# Patient Record
Sex: Male | Born: 1950 | Race: White | Hispanic: No | Marital: Single | Smoking: Former smoker
Health system: Southern US, Community
[De-identification: ages and names within clinical notes are randomized; demographics above are authoritative.]

## PROBLEM LIST (undated history)

## (undated) DIAGNOSIS — E785 Hyperlipidemia, unspecified: Secondary | ICD-10-CM

## (undated) DIAGNOSIS — I10 Essential (primary) hypertension: Secondary | ICD-10-CM

## (undated) DIAGNOSIS — E119 Type 2 diabetes mellitus without complications: Secondary | ICD-10-CM

## (undated) HISTORY — DX: Hyperlipidemia, unspecified: E78.5

## (undated) HISTORY — DX: Type 2 diabetes mellitus without complications: E11.9

## (undated) HISTORY — DX: Essential (primary) hypertension: I10

---

## 2001-08-21 ENCOUNTER — Encounter: Payer: Self-pay | Admitting: Gastroenterology

## 2001-08-21 ENCOUNTER — Ambulatory Visit (HOSPITAL_COMMUNITY): Admission: RE | Admit: 2001-08-21 | Discharge: 2001-08-21 | Payer: Self-pay | Admitting: Gastroenterology

## 2001-09-16 ENCOUNTER — Encounter: Payer: Self-pay | Admitting: General Surgery

## 2001-09-20 ENCOUNTER — Encounter: Payer: Self-pay | Admitting: General Surgery

## 2001-09-20 ENCOUNTER — Observation Stay (HOSPITAL_COMMUNITY): Admission: RE | Admit: 2001-09-20 | Discharge: 2001-09-21 | Payer: Self-pay | Admitting: General Surgery

## 2002-03-18 LAB — HM COLONOSCOPY

## 2004-07-25 ENCOUNTER — Ambulatory Visit: Payer: Self-pay | Admitting: *Deleted

## 2004-10-13 ENCOUNTER — Ambulatory Visit: Payer: Self-pay | Admitting: Endocrinology

## 2005-07-07 ENCOUNTER — Ambulatory Visit: Payer: Self-pay | Admitting: Endocrinology

## 2005-07-14 ENCOUNTER — Ambulatory Visit: Payer: Self-pay | Admitting: Endocrinology

## 2012-01-05 ENCOUNTER — Encounter: Payer: Self-pay | Admitting: Gastroenterology

## 2013-01-08 ENCOUNTER — Encounter: Payer: Self-pay | Admitting: Gastroenterology

## 2013-09-01 DIAGNOSIS — IMO0001 Reserved for inherently not codable concepts without codable children: Secondary | ICD-10-CM | POA: Insufficient documentation

## 2014-08-12 ENCOUNTER — Ambulatory Visit (INDEPENDENT_AMBULATORY_CARE_PROVIDER_SITE_OTHER): Payer: BC Managed Care – PPO | Admitting: Family Medicine

## 2014-08-12 ENCOUNTER — Encounter: Payer: Self-pay | Admitting: Family Medicine

## 2014-08-12 VITALS — BP 190/123 | HR 114 | Ht 72.0 in | Wt 275.0 lb

## 2014-08-12 DIAGNOSIS — I10 Essential (primary) hypertension: Secondary | ICD-10-CM | POA: Insufficient documentation

## 2014-08-12 DIAGNOSIS — R8290 Unspecified abnormal findings in urine: Secondary | ICD-10-CM | POA: Insufficient documentation

## 2014-08-12 DIAGNOSIS — E119 Type 2 diabetes mellitus without complications: Secondary | ICD-10-CM

## 2014-08-12 DIAGNOSIS — E785 Hyperlipidemia, unspecified: Secondary | ICD-10-CM | POA: Insufficient documentation

## 2014-08-12 HISTORY — DX: Type 2 diabetes mellitus without complications: E11.9

## 2014-08-12 NOTE — Progress Notes (Signed)
CC: George SkeetersMark C Spence is a 63 y.o. male is here for Establish Care   Subjective: HPI:  Last name pronounced "Store",  Pleasant 63 year old here to establish care, [employer]  Patient reports a history of type 2 diabetes diagnosed over 10 years ago. He was seeing George Spence for about 4 years, was taking at least metformin and may be other pills but never on injectable medications. He stopped seeing him after he was able to get his diabetes "under control " by reducing carbohydrate intake. Over the past few weeks he's noticed fatigue, increased urinary frequency, awakening every 2 hours at night to urinate and a foul odor to his urine. He is worried that his blood sugars may be rising. No current diet or exercise interventions for type 2 diabetes.  Reports a history of essential hypertension with no outside blood pressures to report. He's never been on any blood pressure lowering medications. He reports a high salt diet and frequently eats soups from [place] restaurants that sound like they have quite a bit of sodium. He is clear that he does not want to address his blood pressure at this time.  Reports a history of hyperlipidemia. He's never been on cholesterol-lowering medication. No known cardiovascular disease. Unsure about most recent lipid panel, likely greater than year ago  Reports malodorous urine describes it as sweet but also foul-smelling. No interventions as of yet. Present all hours of the day. Has been present for at least 1 or 2 weeks does not seem to be getting better or worse since onset.  Review of Systems - General ROS: negative for - chills, fever, night sweats, weight gain or weight loss Ophthalmic ROS: negative for - decreased vision Psychological ROS: negative for - anxiety or depression ENT ROS: negative for - hearing change, nasal congestion, tinnitus or allergies Hematological and Lymphatic ROS: negative for - bleeding problems, bruising or swollen lymph  nodes Breast ROS: negative Respiratory ROS: no cough, , or wheezing Cardiovascular ROS: no chest pain or dyspnea on exertion Gastrointestinal ROS: no abdominal pain, change in bowel habits, or black or bloody stools Genito-Urinary ROS: negative for - genital discharge, genital ulcers, incontinence or abnormal bleeding from genitals Musculoskeletal ROS: negative for - joint pain Neurological ROS: negative for - headaches or memory loss Dermatological ROS: negative for lumps, mole changes, rash and skin lesion changes  Past Medical History  Diagnosis Date  . Hypertension   . Diabetes   . Hyperlipidemia   . Type 2 diabetes mellitus without complication 08/12/2014    History reviewed. No pertinent past surgical history. History reviewed. No pertinent family history.  History   Social History  . Marital Status: Single    Spouse Name: N/A    Number of Children: N/A  . Years of Education: N/A   Occupational History  . Not on file.   Social History Main Topics  . Smoking status: Former Smoker    Quit date: 08/13/1979  . Smokeless tobacco: Not on file  . Alcohol Use: 1.2 oz/week    2 Not specified per week  . Drug Use: No  . Sexual Activity:    Partners: Female   Other Topics Concern  . Not on file   Social History Narrative  . No narrative on file     Objective: BP 190/123 mmHg  Pulse 114  Ht 6' (1.829 m)  Wt 275 lb (124.739 kg)  BMI 37.29 kg/m2  SpO2 94%  General: Alert and Oriented, No Acute Distress HEENT:  Pupils equal, round, reactive to light. Conjunctivae clear.  External ears unremarkable, canals clear with intact TMs with appropriate landmarks.  Middle ear appears open without effusion. Pink inferior turbinates.  Moist mucous membranes, pharynx without inflammation nor lesions.  Neck supple without palpable lymphadenopathy nor abnormal masses. Lungs: Clear to auscultation bilaterally, no wheezing/ronchi/rales.  Comfortable work of breathing. Good air  movement. Cardiac: Regular rate and rhythm. Normal S1/S2.  No murmurs, rubs, nor gallops.  No carotid bruits Abdomen: obese and soft Extremities: No peripheral edema.  Strong peripheral pulses.  Mental Status: No depression, anxiety, nor agitation. Skin: Warm and dry.  Assessment & Plan: George Spence was seen today for establish care.  Diagnoses and associated orders for this visit:  Type 2 diabetes mellitus without complication - Microalbumin / creatinine urine ratio - Hemoglobin A1c - BASIC METABOLIC PANEL WITH GFR  Essential hypertension - BASIC METABOLIC PANEL WITH GFR  Hyperlipidemia  Malodorous urine - Urinalysis, Routine w reflex microscopic    Type 2 diabetes: Clinically uncontrolled checking A1c and renal function today. Well overdue for urine microalbumin creatinine ratio Essential hypertension: Uncontrolled discussed sodium restriction to help with blood pressure. He does not want any further addressing be done to his blood pressure, I let him know that his quite uncontrolled today Hyperlipidemia: Will obtain lipid panel with future when he is fasting Malodorous urine: Urinalysis to rule out UTI, likely glycosuria this contributed to some of the smell.  Return in about 4 weeks (around 09/09/2014) for HTN DM.

## 2014-08-13 ENCOUNTER — Telehealth: Payer: Self-pay | Admitting: Family Medicine

## 2014-08-13 DIAGNOSIS — E119 Type 2 diabetes mellitus without complications: Secondary | ICD-10-CM | POA: Insufficient documentation

## 2014-08-13 DIAGNOSIS — R809 Proteinuria, unspecified: Principal | ICD-10-CM

## 2014-08-13 DIAGNOSIS — E11319 Type 2 diabetes mellitus with unspecified diabetic retinopathy without macular edema: Secondary | ICD-10-CM | POA: Insufficient documentation

## 2014-08-13 DIAGNOSIS — E1129 Type 2 diabetes mellitus with other diabetic kidney complication: Secondary | ICD-10-CM

## 2014-08-13 LAB — URINALYSIS, MICROSCOPIC ONLY
Casts: NONE SEEN
Crystals: NONE SEEN
Squamous Epithelial / HPF: NONE SEEN

## 2014-08-13 LAB — MICROALBUMIN / CREATININE URINE RATIO
Creatinine, Urine: 156 mg/dL
Microalb Creat Ratio: 1138.5 mg/g — ABNORMAL HIGH (ref 0.0–30.0)
Microalb, Ur: 177.6 mg/dL — ABNORMAL HIGH (ref <2.0)

## 2014-08-13 LAB — URINALYSIS, ROUTINE W REFLEX MICROSCOPIC
Bilirubin Urine: NEGATIVE
Glucose, UA: NEGATIVE mg/dL
Ketones, ur: NEGATIVE mg/dL
Nitrite: NEGATIVE
Protein, ur: 100 mg/dL — AB
Specific Gravity, Urine: 1.021 (ref 1.005–1.030)
Urobilinogen, UA: 1 mg/dL (ref 0.0–1.0)
pH: 5.5 (ref 5.0–8.0)

## 2014-08-13 LAB — BASIC METABOLIC PANEL WITH GFR
BUN: 22 mg/dL (ref 6–23)
CO2: 27 mEq/L (ref 19–32)
Calcium: 9.3 mg/dL (ref 8.4–10.5)
Chloride: 101 mEq/L (ref 96–112)
Creat: 1.33 mg/dL (ref 0.50–1.35)
GFR, Est African American: 65 mL/min
GFR, Est Non African American: 56 mL/min — ABNORMAL LOW
Glucose, Bld: 253 mg/dL — ABNORMAL HIGH (ref 70–99)
Potassium: 4.3 mEq/L (ref 3.5–5.3)
Sodium: 138 mEq/L (ref 135–145)

## 2014-08-13 LAB — HEMOGLOBIN A1C
Hgb A1c MFr Bld: 9.4 % — ABNORMAL HIGH (ref <5.7)
Mean Plasma Glucose: 223 mg/dL — ABNORMAL HIGH (ref <117)

## 2014-08-13 MED ORDER — LISINOPRIL 20 MG PO TABS: 20.0000 mg | ORAL_TABLET | Freq: Every day | ORAL | Status: DC

## 2014-08-13 MED ORDER — GLYBURIDE-METFORMIN 2.5-500 MG PO TABS: 2.0000 | ORAL_TABLET | Freq: Two times a day (BID) | ORAL | Status: DC

## 2014-08-13 NOTE — Telephone Encounter (Signed)
[name], Will you please let patient know that his A1c was 9.4 with a goal of less than 7.  I've sent a metformin containing diabetic [facility] which will make a big impact at improving his blood sugar.  His kidney function and urine studies is showing a degree of protein in his urine that reflects mild to moderate kidney impairment due to his diabetes.  I'd recommend he start an additional medication called lisinopril which will help BP bust most importantly help preserve kidney function.  F/U with me 1 month.

## 2014-08-13 NOTE — Telephone Encounter (Signed)
Message left on pt's vm with results

## 2014-09-15 ENCOUNTER — Encounter: Payer: Self-pay | Admitting: Family Medicine

## 2014-09-15 ENCOUNTER — Ambulatory Visit (INDEPENDENT_AMBULATORY_CARE_PROVIDER_SITE_OTHER): Payer: BC Managed Care – PPO | Admitting: Family Medicine

## 2014-09-15 VITALS — BP 179/93 | HR 75 | Ht 72.0 in | Wt 275.0 lb

## 2014-09-15 DIAGNOSIS — R809 Proteinuria, unspecified: Secondary | ICD-10-CM

## 2014-09-15 DIAGNOSIS — I10 Essential (primary) hypertension: Secondary | ICD-10-CM | POA: Diagnosis not present

## 2014-09-15 DIAGNOSIS — E1129 Type 2 diabetes mellitus with other diabetic kidney complication: Secondary | ICD-10-CM

## 2014-09-15 DIAGNOSIS — E119 Type 2 diabetes mellitus without complications: Secondary | ICD-10-CM | POA: Diagnosis not present

## 2014-09-15 NOTE — Progress Notes (Signed)
CC: George Spence is a 63 y.o. male is here for Follow-up   Subjective: HPI:  Follow-up type 2 diabetes: Since I saw him last he began taking full dose of glyburide/metformin and for the first 3 days had a foggy cognitive sensation, clammy feeling and just felt off therefore stop this medication and within a day felt back to his normal self. He restarted taking this medication but only 2.5/500 mg daily. He's not had a reoccurrence of any assistive symptoms. He brings in blood sugars that have been taken randomly throughout the day all of which have been between 202 175. He reports that he is no longer waking 3-4 times at night to urinate, now on average only once. He denies polyphagia or polydipsia nor poorly healing wounds.  He has begun taking lisinopril since I saw him last with no outside blood pressures to report. Again he only wants to focus on eating blood sugar under control first before addressing blood pressure   Review Of Systems Outlined In HPI  Past Medical History  Diagnosis Date  . Hypertension   . Diabetes   . Hyperlipidemia   . Type 2 diabetes mellitus without complication 08/12/2014    No past surgical history on file. No family history on file.  History   Social History  . Marital Status: Single    Spouse Name: N/A    Number of Children: N/A  . Years of Education: N/A   Occupational History  . Not on file.   Social History Main Topics  . Smoking status: Former Smoker    Quit date: 08/13/1979  . Smokeless tobacco: Not on file  . Alcohol Use: 1.2 oz/week    2 Not specified per week  . Drug Use: No  . Sexual Activity:    Partners: Female   Other Topics Concern  . Not on file   Social History Narrative     Objective: BP 179/93 mmHg  Pulse 75  Ht 6' (1.829 m)  Wt 275 lb (124.739 kg)  BMI 37.29 kg/m2  General: Alert and Oriented, No Acute Distress HEENT: Pupils equal, round, reactive to light. Conjunctivae clear.  Moist mucous membranes Lungs:  Clear to auscultation bilaterally, no wheezing/ronchi/rales.  Comfortable work of breathing. Good air movement. Cardiac: Regular rate and rhythm. Normal S1/S2.  No murmurs, rubs, nor gallops.   Abdomen: Obese Extremities: No peripheral edema.  Strong peripheral pulses.  Mental Status: No depression, anxiety, nor agitation. Skin: Warm and dry.  Assessment & Plan: George Spence was seen today for follow-up.  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 Discussed that his clamminess, fatigue and cognitive issues were likely due to an abrupt lowering of his blood sugar however it's unlikely that it was in the hypoglycemic range. Discussed that a ramping up regimen of adding an additional 2. 5-500 milligram tablet of glyburide/metformin every week until he gets with total of 2 in the morning and 2 in the evening to maximize his blood sugar control.   Return in about 2 months (around 11/16/2014).

## 2014-11-16 ENCOUNTER — Ambulatory Visit: Payer: BC Managed Care – PPO | Admitting: Family Medicine

## 2014-12-01 ENCOUNTER — Other Ambulatory Visit: Payer: Self-pay

## 2014-12-01 DIAGNOSIS — R809 Proteinuria, unspecified: Principal | ICD-10-CM

## 2014-12-01 DIAGNOSIS — E1129 Type 2 diabetes mellitus with other diabetic kidney complication: Secondary | ICD-10-CM

## 2014-12-01 MED ORDER — GLYBURIDE-METFORMIN 2.5-500 MG PO TABS
2.0000 | ORAL_TABLET | Freq: Two times a day (BID) | ORAL | Status: DC
Start: 2014-12-01 — End: 2015-05-19

## 2014-12-01 MED ORDER — LISINOPRIL 20 MG PO TABS: 20.0000 mg | ORAL_TABLET | Freq: Every day | ORAL | Status: DC

## 2015-02-06 ENCOUNTER — Other Ambulatory Visit: Payer: Self-pay | Admitting: Family Medicine

## 2015-05-19 ENCOUNTER — Other Ambulatory Visit: Payer: Self-pay | Admitting: *Deleted

## 2015-05-19 DIAGNOSIS — E1129 Type 2 diabetes mellitus with other diabetic kidney complication: Secondary | ICD-10-CM

## 2015-05-19 DIAGNOSIS — R809 Proteinuria, unspecified: Principal | ICD-10-CM

## 2015-05-19 MED ORDER — GLYBURIDE-METFORMIN 2.5-500 MG PO TABS: 2.0000 | ORAL_TABLET | Freq: Two times a day (BID) | ORAL | Status: DC

## 2015-05-26 ENCOUNTER — Ambulatory Visit (INDEPENDENT_AMBULATORY_CARE_PROVIDER_SITE_OTHER): Payer: BLUE CROSS/BLUE SHIELD | Admitting: Family Medicine

## 2015-05-26 ENCOUNTER — Encounter: Payer: Self-pay | Admitting: Family Medicine

## 2015-05-26 VITALS — BP 173/80 | HR 65 | Wt 284.0 lb

## 2015-05-26 DIAGNOSIS — E119 Type 2 diabetes mellitus without complications: Secondary | ICD-10-CM

## 2015-05-26 DIAGNOSIS — I10 Essential (primary) hypertension: Secondary | ICD-10-CM | POA: Diagnosis not present

## 2015-05-26 DIAGNOSIS — E1129 Type 2 diabetes mellitus with other diabetic kidney complication: Secondary | ICD-10-CM

## 2015-05-26 DIAGNOSIS — R809 Proteinuria, unspecified: Secondary | ICD-10-CM

## 2015-05-26 MED ORDER — LISINOPRIL 30 MG PO TABS: 30.0000 mg | ORAL_TABLET | Freq: Every day | ORAL | Status: DC

## 2015-05-26 MED ORDER — GLYBURIDE-METFORMIN 2.5-500 MG PO TABS: 2.0000 | ORAL_TABLET | Freq: Two times a day (BID) | ORAL | Status: DC

## 2015-05-26 NOTE — Progress Notes (Signed)
CC: George Spence is a 64 y.o. male is here for Diabetes   Subjective: HPI:  Follow-up type 2 diabetes: Continues to take gllyburide-metformin 2 tabs twice a day. He is no longer having any side effects from it. No outside blood sugars to report. No polyphagia polydipsia polyuria but he has had about 5 pounds of weight gain unintentionally. He tells me that he is not following any specific diet right now. Stays active with hobbies such as carpentry.  Follow-up essential hypertension: No outside blood pressures to report. Taking lisinopril 20 mg daily. No chest pain shortness of breath orthopnea nor peripheral edema   Review Of Systems Outlined In HPI  Past Medical History  Diagnosis Date  . Hypertension   . Diabetes   . Hyperlipidemia   . Type 2 diabetes mellitus without complication 08/12/2014    No past surgical history on file. No family history on file.  Social History   Social History  . Marital Status: Single    Spouse Name: N/A  . Number of Children: N/A  . Years of Education: N/A   Occupational History  . Not on file.   Social History Main Topics  . Smoking status: Former Smoker    Quit date: 08/13/1979  . Smokeless tobacco: Not on file  . Alcohol Use: 1.2 oz/week    2 Standard drinks or equivalent per week  . Drug Use: No  . Sexual Activity:    Partners: Female   Other Topics Concern  . Not on file   Social History Narrative     Objective: BP 173/80 mmHg  Pulse 65  Wt 284 lb (128.822 kg)  General: Alert and Oriented, No Acute Distress HEENT: Pupils equal, round, reactive to light. Conjunctivae clear.  Moist mucous membranes Lungs: Clear to auscultation bilaterally, no wheezing/ronchi/rales.  Comfortable work of breathing. Good air movement. Cardiac: Regular rate and rhythm. Normal S1/S2.  No murmurs, rubs, nor gallops.   Abdomen: Obese and soft Extremities: No peripheral edema.  Strong peripheral pulses.  Mental Status: No depression, anxiety, nor  agitation. Skin: Warm and dry.  Assessment & Plan: Keano was seen today for diabetes.  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 Keep f/u appt on 8/24 -     lisinopril (PRINIVIL,ZESTRIL) 30 MG tablet; Take 1 tablet (30 mg total) by mouth daily. Replaces  formulation for better blood pressure control.  Essential hypertension   Type 2 diabetes: A1c of 5.9, controlled, great improvement in A1c.  I also gave him a folder of diabetic recipes to help make wiser food choices at home. Essential hypertension: Uncontrolled, increasing lisinopril.  Return in about 3 months (around 08/26/2015).

## 2015-06-08 ENCOUNTER — Other Ambulatory Visit: Payer: Self-pay | Admitting: Family Medicine

## 2015-06-08 DIAGNOSIS — E1129 Type 2 diabetes mellitus with other diabetic kidney complication: Secondary | ICD-10-CM

## 2015-06-08 DIAGNOSIS — R809 Proteinuria, unspecified: Principal | ICD-10-CM

## 2015-06-08 MED ORDER — GLYBURIDE-METFORMIN 2.5-500 MG PO TABS: 2.0000 | ORAL_TABLET | Freq: Two times a day (BID) | ORAL | Status: DC

## 2015-07-16 ENCOUNTER — Other Ambulatory Visit: Payer: Self-pay | Admitting: Family Medicine

## 2015-08-17 ENCOUNTER — Other Ambulatory Visit: Payer: Self-pay | Admitting: Family Medicine

## 2015-08-21 ENCOUNTER — Other Ambulatory Visit: Payer: Self-pay | Admitting: Family Medicine

## 2015-10-11 ENCOUNTER — Ambulatory Visit: Payer: Self-pay | Admitting: Family Medicine

## 2015-10-15 ENCOUNTER — Ambulatory Visit (INDEPENDENT_AMBULATORY_CARE_PROVIDER_SITE_OTHER): Payer: Commercial Managed Care - HMO | Admitting: Family Medicine

## 2015-10-15 ENCOUNTER — Encounter: Payer: Self-pay | Admitting: Family Medicine

## 2015-10-15 VITALS — BP 157/87 | HR 73 | Wt 285.0 lb

## 2015-10-15 DIAGNOSIS — R809 Proteinuria, unspecified: Secondary | ICD-10-CM | POA: Diagnosis not present

## 2015-10-15 DIAGNOSIS — E119 Type 2 diabetes mellitus without complications: Secondary | ICD-10-CM

## 2015-10-15 DIAGNOSIS — I10 Essential (primary) hypertension: Secondary | ICD-10-CM

## 2015-10-15 DIAGNOSIS — Z23 Encounter for immunization: Secondary | ICD-10-CM

## 2015-10-15 DIAGNOSIS — E1129 Type 2 diabetes mellitus with other diabetic kidney complication: Secondary | ICD-10-CM

## 2015-10-15 LAB — POCT GLYCOSYLATED HEMOGLOBIN (HGB A1C): Hemoglobin A1C: 6

## 2015-10-15 MED ORDER — GLYBURIDE-METFORMIN 2.5-500 MG PO TABS: 2.0000 | ORAL_TABLET | Freq: Two times a day (BID) | ORAL | Status: DC

## 2015-10-15 MED ORDER — ZOSTER VACCINE LIVE 19400 UNT/0.65ML ~~LOC~~ SOLR: 0.6500 mL | Freq: Once | SUBCUTANEOUS | Status: DC

## 2015-10-15 MED ORDER — LISINOPRIL 30 MG PO TABS: 30.0000 mg | ORAL_TABLET | Freq: Every day | ORAL | Status: DC

## 2015-10-15 MED ORDER — PNEUMOCOCCAL 13-VAL CONJ VACC IM SUSP: 0.5000 mL | INTRAMUSCULAR | Status: DC

## 2015-10-15 NOTE — Progress Notes (Signed)
CC: George Spence C Milian is a 65 y.o. male is here for Hypertension and Hyperglycemia   Subjective: HPI:  Follow-up essential hypertension: Taking lisinopril on a daily basis. No outside blood pressures report. Denies chest pain shortness of breath orthopnea nor peripheral edema. He tells me that there is a large room for improvement with respect to sodium restriction.  Follow-up type 2 diabetes: Taking glyburide/metformin on a daily basis. No outside blood sugars to report. Denies polyuria polyphagia or polydipsia. No known side effects   Review Of Systems Outlined In HPI  Past Medical History  Diagnosis Date  . Hypertension   . Diabetes (HCC)   . Hyperlipidemia   . Type 2 diabetes mellitus without complication (HCC) 08/12/2014    No past surgical history on file. No family history on file.  Social History   Social History  . Marital Status: Single    Spouse Name: N/A  . Number of Children: N/A  . Years of Education: N/A   Occupational History  . Not on file.   Social History Main Topics  . Smoking status: Former Smoker    Quit date: 08/13/1979  . Smokeless tobacco: Not on file  . Alcohol Use: 1.2 oz/week    2 Standard drinks or equivalent per week  . Drug Use: No  . Sexual Activity:    Partners: Female   Other Topics Concern  . Not on file   Social History Narrative     Objective: BP 157/87 mmHg  Pulse 73  Wt 285 lb (129.275 kg)  Vital signs reviewed. General: Alert and Oriented, No Acute Distress HEENT: Pupils equal, round, reactive to light. Conjunctivae clear.  External ears unremarkable.  Moist mucous membranes. Lungs: Clear and comfortable work of breathing, speaking in full sentences without accessory muscle use. Cardiac: Regular rate and rhythm.  Neuro: CN II-XII grossly intact, gait normal. Extremities: No peripheral edema.  Strong peripheral pulses.  Mental Status: No depression, anxiety, nor agitation. Logical though process. Skin: Warm and  dry. Assessment & Plan: [name] was seen today for hypertension and hyperglycemia.  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 Replaces 20mg  formulation for better blood pressure control. -     POCT HgB A1C  Need for prophylactic vaccination against Streptococcus pneumoniae (pneumococcus) -     pneumococcal 13-valent conjugate vaccine (PREVNAR 13) injection 0.5 mL; Inject 0.5 mLs into the muscle tomorrow at 10 am.  Other orders -     glyBURIDE-metformin (GLUCOVANCE) 2.5-500 MG tablet; Take 2 tablets by mouth 2 (two) times daily with a meal. -     zoster vaccine live, PF, (ZOSTAVAX) [id number] UNT/0.65ML injection; Inject 19,400 Units into the skin once.   Type 2 diabetes: A1c of 6.0 today, controlled, continue current dose of glyburide/metformin. Essential hypertension: Uncontrolled chronic condition discussed ways of reducing sodium in his diet, it sounds like he is taking in much more than his daily recommended value of less than 2000 mg daily. Continue lisinopril  Return in about 6 months (around 04/13/2016).

## 2015-10-28 ENCOUNTER — Other Ambulatory Visit: Payer: Self-pay

## 2015-10-28 DIAGNOSIS — R809 Proteinuria, unspecified: Principal | ICD-10-CM

## 2015-10-28 DIAGNOSIS — E1129 Type 2 diabetes mellitus with other diabetic kidney complication: Secondary | ICD-10-CM

## 2015-10-28 MED ORDER — LISINOPRIL 30 MG PO TABS: 30.0000 mg | ORAL_TABLET | Freq: Every day | ORAL | Status: DC

## 2015-10-28 MED ORDER — GLYBURIDE-METFORMIN 2.5-500 MG PO TABS: 2.0000 | ORAL_TABLET | Freq: Two times a day (BID) | ORAL | Status: DC

## 2016-01-27 ENCOUNTER — Other Ambulatory Visit: Payer: Self-pay | Admitting: Family Medicine

## 2016-04-14 ENCOUNTER — Ambulatory Visit: Payer: BLUE CROSS/BLUE SHIELD | Admitting: Family Medicine

## 2016-04-18 ENCOUNTER — Ambulatory Visit (INDEPENDENT_AMBULATORY_CARE_PROVIDER_SITE_OTHER): Payer: Commercial Managed Care - HMO | Admitting: Family Medicine

## 2016-04-18 ENCOUNTER — Encounter: Payer: Self-pay | Admitting: Family Medicine

## 2016-04-18 VITALS — BP 155/81 | HR 71 | Wt 287.0 lb

## 2016-04-18 DIAGNOSIS — I10 Essential (primary) hypertension: Secondary | ICD-10-CM

## 2016-04-18 DIAGNOSIS — E119 Type 2 diabetes mellitus without complications: Secondary | ICD-10-CM | POA: Diagnosis not present

## 2016-04-18 DIAGNOSIS — R809 Proteinuria, unspecified: Secondary | ICD-10-CM

## 2016-04-18 DIAGNOSIS — E1129 Type 2 diabetes mellitus with other diabetic kidney complication: Secondary | ICD-10-CM

## 2016-04-18 LAB — LIPID PANEL
Cholesterol: 162 mg/dL (ref 125–200)
HDL: 33 mg/dL — ABNORMAL LOW (ref >=40)
LDL CALC: 92 mg/dL (ref <130)
Total CHOL/HDL Ratio: 4.9 Ratio (ref <=5.0)
Triglycerides: 187 mg/dL — ABNORMAL HIGH (ref <150)
VLDL: 37 mg/dL — AB (ref <30)

## 2016-04-18 MED ORDER — LISINOPRIL 40 MG PO TABS: 40.0000 mg | ORAL_TABLET | Freq: Every day | ORAL | Status: DC

## 2016-04-18 NOTE — Progress Notes (Signed)
CC: George SkeetersMark C Spence is a 65 y.o. male is here for Hyperglycemia and Hypertension   Subjective: HPI:  Follow-up type 2 diabetes: No outside blood sugars to report. Denies chest pain, polyuria plication polydipsia. He is doing his best to minimize carbohydrate intake. Taking Glucovance with 100% compliance.  Follow-up essential hypertension: Taking lisinopril at 100% compliance. No chest pain, cough, shortness of breath peripheral edema nor motor or sensory disturbances   Review Of Systems Outlined In HPI  Past Medical History  Diagnosis Date  . Hypertension   . Diabetes (HCC)   . Hyperlipidemia   . Type 2 diabetes mellitus without complication (HCC) 08/12/2014    No past surgical history on file. No family history on file.  Social History   Social History  . Marital Status: Single    Spouse Name: N/A  . Number of Children: N/A  . Years of Education: N/A   Occupational History  . Not on file.   Social History Main Topics  . Smoking status: Former Smoker    Quit date: 08/13/1979  . Smokeless tobacco: Not on file  . Alcohol Use: 1.2 oz/week    2 Standard drinks or equivalent per week  . Drug Use: No  . Sexual Activity:    Partners: Female   Other Topics Concern  . Not on file   Social History Narrative     Objective: BP 155/81 mmHg  Pulse 71  Wt 287 lb (130.182 kg)  General: Alert and Oriented, No Acute Distress HEENT: Pupils equal, round, reactive to light. Conjunctivae clear.  Moist mucous membranes  Lungs: Clear to auscultation bilaterally, no wheezing/ronchi/rales.  Comfortable work of breathing. Good air movement. Cardiac: Regular rate and rhythm. Normal S1/S2.  No murmurs, rubs, nor gallops.   Extremities: No peripheral edema.  Strong peripheral pulses.  Mental Status: No depression, anxiety, nor agitation. Skin: Warm and dry.  Assessment & Plan: George Spence was seen today for hyperglycemia and hypertension.  Diagnoses and all orders for this visit:  Type 2  diabetes mellitus with microalbuminuria or microproteinuria -     Hemoglobin A1c -     Lipid panel -     lisinopril (PRINIVIL,ZESTRIL) 40 MG tablet; Take 1 tablet (40 mg total) by mouth daily. Replaces 30mg  formulation for better blood pressure control.  Essential hypertension   Type 2 diabetes: Due for repeat A1c, also due for lipid panel. Essential hypertension: Uncontrolled chronic condition increasing lisinopril. He's decided. He doesn't want to take the shingles vaccine  Discussed with this patient that I will be resigning from my [facility] in September in order to stay with my [place]. I let him know about the providers that are still accepting patients and I feel that this individual will be under great care if he/she stays [facility].  Return in about 3 months (around 07/19/2016), or if symptoms worsen or fail to improve.

## 2016-04-19 ENCOUNTER — Telehealth: Payer: Self-pay | Admitting: Family Medicine

## 2016-04-19 LAB — HEMOGLOBIN A1C
HEMOGLOBIN A1C: 6 % — AB (ref <5.7)
Mean Plasma Glucose: 126 mg/dL

## 2016-04-19 MED ORDER — ATORVASTATIN CALCIUM 20 MG PO TABS: 20.0000 mg | ORAL_TABLET | Freq: Every day | ORAL | Status: DC

## 2016-04-19 NOTE — Telephone Encounter (Signed)
Will you please let patient know that his A1c looks great with a value of 6.0. Based on his cholesterol numbers he has a 38% chance of having a heart attack in the next 10 years and I would recommend he start on medication called atorvastatin to help lower cholesterol and reduce this risk. (Prescription printed off in your box if he is interested)

## 2016-04-19 NOTE — Telephone Encounter (Signed)
Awaiting call back.

## 2016-04-21 ENCOUNTER — Other Ambulatory Visit: Payer: Self-pay

## 2016-04-21 MED ORDER — GLYBURIDE-METFORMIN 2.5-500 MG PO TABS: ORAL_TABLET | ORAL | Status: DC

## 2016-04-21 NOTE — Telephone Encounter (Signed)
Pt.notified

## 2016-04-27 ENCOUNTER — Other Ambulatory Visit: Payer: Self-pay | Admitting: Family Medicine

## 2016-07-21 ENCOUNTER — Other Ambulatory Visit: Payer: Self-pay | Admitting: *Deleted

## 2016-07-21 MED ORDER — GLYBURIDE-METFORMIN 2.5-500 MG PO TABS: ORAL_TABLET | ORAL | 0 refills | Status: DC

## 2016-10-03 ENCOUNTER — Other Ambulatory Visit: Payer: Self-pay

## 2016-10-03 MED ORDER — LISINOPRIL 40 MG PO TABS: 40.0000 mg | ORAL_TABLET | Freq: Every day | ORAL | 1 refills | Status: DC

## 2016-10-19 ENCOUNTER — Ambulatory Visit: Payer: Commercial Managed Care - HMO | Admitting: Osteopathic Medicine

## 2016-10-30 ENCOUNTER — Encounter: Payer: Self-pay | Admitting: Osteopathic Medicine

## 2016-10-30 ENCOUNTER — Ambulatory Visit (INDEPENDENT_AMBULATORY_CARE_PROVIDER_SITE_OTHER): Payer: Medicare HMO | Admitting: Osteopathic Medicine

## 2016-10-30 VITALS — BP 202/102 | HR 88 | Ht 72.0 in | Wt 274.0 lb

## 2016-10-30 DIAGNOSIS — E119 Type 2 diabetes mellitus without complications: Secondary | ICD-10-CM

## 2016-10-30 DIAGNOSIS — E785 Hyperlipidemia, unspecified: Secondary | ICD-10-CM | POA: Diagnosis not present

## 2016-10-30 DIAGNOSIS — Z23 Encounter for immunization: Secondary | ICD-10-CM

## 2016-10-30 DIAGNOSIS — I10 Essential (primary) hypertension: Secondary | ICD-10-CM

## 2016-10-30 LAB — COMPLETE METABOLIC PANEL WITH GFR
ALT: 19 U/L (ref 9–46)
AST: 18 U/L (ref 10–35)
Albumin: 4.6 g/dL (ref 3.6–5.1)
Alkaline Phosphatase: 53 U/L (ref 40–115)
BUN: 24 mg/dL (ref 7–25)
CALCIUM: 9.6 mg/dL (ref 8.6–10.3)
CHLORIDE: 102 mmol/L (ref 98–110)
CO2: 30 mmol/L (ref 20–31)
Creat: 1.28 mg/dL — ABNORMAL HIGH (ref 0.70–1.25)
GFR, Est African American: 67 mL/min (ref >=60)
GFR, Est Non African American: 58 mL/min — ABNORMAL LOW (ref >=60)
Glucose, Bld: 88 mg/dL (ref 65–99)
POTASSIUM: 4.5 mmol/L (ref 3.5–5.3)
Sodium: 141 mmol/L (ref 135–146)
Total Bilirubin: 0.9 mg/dL (ref 0.2–1.2)
Total Protein: 7.7 g/dL (ref 6.1–8.1)

## 2016-10-30 LAB — CBC WITH DIFFERENTIAL/PLATELET
Basophils Absolute: 0 cells/uL (ref 0–200)
Basophils Relative: 0 %
EOS PCT: 3 %
Eosinophils Absolute: 261 cells/uL (ref 15–500)
HEMATOCRIT: 47 % (ref 38.5–50.0)
Hemoglobin: 16.7 g/dL (ref 13.2–17.1)
LYMPHS PCT: 16 %
Lymphs Abs: 1392 cells/uL (ref 850–3900)
MCH: 36.4 pg — ABNORMAL HIGH (ref 27.0–33.0)
MCHC: 35.5 g/dL (ref 32.0–36.0)
MCV: 102.4 fL — ABNORMAL HIGH (ref 80.0–100.0)
MONO ABS: 870 {cells}/uL (ref 200–950)
MONOS PCT: 10 %
MPV: 9.7 fL (ref 7.5–12.5)
NEUTROS PCT: 71 %
Neutro Abs: 6177 cells/uL (ref 1500–7800)
PLATELETS: 237 10*3/uL (ref 140–400)
RBC: 4.59 MIL/uL (ref 4.20–5.80)
RDW: 14.7 % (ref 11.0–15.0)
WBC: 8.7 10*3/uL (ref 3.8–10.8)

## 2016-10-30 LAB — LIPID PANEL
CHOL/HDL RATIO: 4.6 ratio (ref <5.0)
CHOLESTEROL: 161 mg/dL (ref <200)
HDL: 35 mg/dL — ABNORMAL LOW (ref >40)
LDL Cholesterol: 103 mg/dL — ABNORMAL HIGH (ref <100)
TRIGLYCERIDES: 117 mg/dL (ref <150)
VLDL: 23 mg/dL (ref <30)

## 2016-10-30 LAB — TSH: TSH: 4.16 mIU/L (ref 0.40–4.50)

## 2016-10-30 LAB — POCT GLYCOSYLATED HEMOGLOBIN (HGB A1C): HEMOGLOBIN A1C: 5.4

## 2016-10-30 MED ORDER — LOSARTAN POTASSIUM-HCTZ 100-25 MG PO TABS: 0.5000 | ORAL_TABLET | Freq: Every day | ORAL | 1 refills | Status: DC

## 2016-10-30 MED ORDER — GLYBURIDE-METFORMIN 2.5-500 MG PO TABS: 2.0000 | ORAL_TABLET | Freq: Every day | ORAL | 3 refills | Status: DC

## 2016-10-30 MED ORDER — GLYBURIDE-METFORMIN 2.5-500 MG PO TABS: 2.0000 | ORAL_TABLET | Freq: Every day | ORAL | 1 refills | Status: DC

## 2016-10-30 NOTE — Patient Instructions (Signed)
We are starting a new medication for your blood pressure in getting some labs done today.

## 2016-10-30 NOTE — Progress Notes (Signed)
HPI: George Spence is a 66 y.o. male  who presents to [facility] [place] today, 10/30/16,  for chief complaint of:  Chief Complaint  Patient presents with  . Other    switch from [place]     HTN: BP elevated in office today. Patient has not taken lisinopril in several months, said it "just didn't make me feel very good." Patient states he has home blood pressure cuff, did have arm cuff but this was a bit cumbersome, wrist cuff recently. These items are not with him in the office today for verification.   DIABETES SCREENING/PREVENTIVE CARE: A1C past 3-6 mos: Yes  controlled? Yes  BP goal <140/90: No  LDL goal <100: Yes 04/2016 Eye exam annually: none on file, importance discussed with patient Foot exam: Pending Microalbuminuria:n/a on ACE Metformin: Yes  ACE/ARB: Yes  Antiplatelet if ASCVD Risk >10%: No  Statin: Yes  Pneumovax: Patient states he had pneumonia shot at some point, can recall when that he thinks it was in this office, we don't have records. Flu vaccine: 10/30/16        Past medical history, surgical history, social history and family history reviewed.  Patient Active Problem List   Diagnosis Date Noted  . 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 Essential hypertension 08/12/2014  . Hyperlipidemia 08/12/2014    Current medication list and allergy/intolerance information reviewed.   Current Outpatient Prescriptions on File Prior to Visit  Medication Sig Dispense Refill  . atorvastatin (LIPITOR) 20 MG tablet Take 1 tablet (20 mg total) by mouth daily. 90 tablet 1  . glyBURIDE-metformin (GLUCOVANCE) 2.5-500 MG tablet Take 2 tablets by mouth two times daily with a meal Due for f/u appt. Please call the office to schedule 120 tablet 0  . lisinopril (PRINIVIL,ZESTRIL) 40 MG tablet Take 1 tablet (40 mg total) by mouth daily. 90 tablet 1  . zoster vaccine live, PF, (ZOSTAVAX) 16109 UNT/0.65ML  injection Inject 19,400 Units into the skin once. 1 each 0   No current facility-administered medications on file prior to visit.    No Known Allergies    Review of Systems:  Constitutional: No recent illness  HEENT: No  headache, no vision change  Cardiac: No  chest pain, No  pressure, No palpitations  Respiratory:  No  shortness of breath. No  Cough  Gastrointestinal: No  abdominal pain, no change on bowel habits  Musculoskeletal: No new myalgia/arthralgia  Skin: No  Rash  Hem/Onc: No  easy bruising/bleeding, No  abnormal lumps/bumps  Neurologic: No  weakness, No  Dizziness  Psychiatric: No  concerns with depression, No  concerns with anxiety  Exam:  BP (!) 202/102   Pulse 88   Ht 6' (1.829 m)   Wt 274 lb (124.3 kg)   BMI 37.16 kg/m blood pressure measurement confirmed on manual recheck  Constitutional: VS see above. General Appearance: alert, well-developed, well-nourished, NAD  Eyes: Normal lids and conjunctive, non-icteric sclera  Ears, Nose, Mouth, Throat: MMM, Normal external inspection ears/nares/mouth/lips/gums.  Neck: No masses, trachea midline.   Respiratory: Normal respiratory effort. no wheeze, no rhonchi, no rales  Cardiovascular: S1/S2 normal, no murmur, no rub/gallop auscultated. RRR.   Musculoskeletal: Gait normal. Symmetric and independent movement of all extremities  Neurological: Normal balance/coordination. No tremor.  Skin: warm, dry, intact.   Psychiatric: Normal judgment/insight. Normal mood and affect. Oriented x3.    Recent Results (from the past 2160 hour(s))  POCT HgB A1C  Status: None   Collection Time: 10/30/16  9:59 AM  Result Value Ref Range   Hemoglobin A1C 5.4        ASSESSMENT/PLAN:   Will go ahead and refill diabetes medications, A1c sexually pretty good control to the primary think we can back off on the medicines a little bit. Plan to just do morning dose and recheck A1c in another 3 months. Note: Patient  requested additional medications so that he can have some on hand at his second home in [place]. Advised can I'm not sure that insurance will cover all these. We'll need to see.  Blood pressure is a concern today. Patient was intolerant to lisinopril but no serious allergies/reaction. Will trial combination therapy, get labs today.  Type 2 diabetes mellitus without complication, without long-term current use of insulin (HCC) - Plan: POCT HgB A1C, glyBURIDE-metformin (GLUCOVANCE) 2.5-500 MG tablet, DISCONTINUED: glyBURIDE-metformin (GLUCOVANCE) 2.5-500 MG tablet  Need for influenza vaccination - Plan: Flu Vaccine QUAD 36+ mos IM  Essential hypertension - Plan: CBC with Differential/Platelet, COMPLETE METABOLIC PANEL WITH GFR, Lipid panel, TSH, losartan-hydrochlorothiazide (HYZAAR) 100-25 MG tablet  Need for vaccination with 13-polyvalent pneumococcal conjugate vaccine - Plan: Pneumococcal conjugate vaccine 13-valent  Hyperlipidemia, unspecified hyperlipidemia type    Patient Instructions  We are starting a new medication for your blood pressure in getting some labs done today.        Follow-up plan: Return in about 2 weeks (around 11/13/2016) for BLOOD PRESSURE RECHECK .  Visit summary with medication list and pertinent instructions was printed for patient to review, alert us if any changes needed. All questions at time of visit were answered - patient instructed to contact office with any additional concerns. ER/RTC precautions were reviewed with the patient and understanding verbalized.   Note: Total time spent 25 minutes, greater than 50% of the visit was spent face-to-face counseling and coordinating care for the following: The primary encounter diagnosis was Type 2 diabetes mellitus without complication, without long-term current use of insulin (HCC). Diagnoses of Need for influenza vaccination, Essential hypertension, Need for vaccination with 13-polyvalent pneumococcal conjugate  vaccine, and Hyperlipidemia, unspecified hyperlipidemia type were also pertinent to this visit..Marland Kitchen

## 2016-11-07 ENCOUNTER — Telehealth: Payer: Self-pay | Admitting: Sports Medicine

## 2016-11-07 NOTE — Telephone Encounter (Signed)
Sent prior authorization for Glyburide-Metformin through cover my meds waiting on response. - CF

## 2016-11-08 NOTE — Telephone Encounter (Signed)
Glyburide-metformin has been approved through the insurance effective date 09/30/2016 through 10/01/2017. Notified pattient and pharm.

## 2016-11-13 ENCOUNTER — Encounter: Payer: Self-pay | Admitting: Osteopathic Medicine

## 2016-11-13 ENCOUNTER — Ambulatory Visit (INDEPENDENT_AMBULATORY_CARE_PROVIDER_SITE_OTHER): Payer: Medicare HMO | Admitting: Osteopathic Medicine

## 2016-11-13 VITALS — BP 163/90 | HR 84 | Ht 72.0 in | Wt 282.0 lb

## 2016-11-13 DIAGNOSIS — E119 Type 2 diabetes mellitus without complications: Secondary | ICD-10-CM

## 2016-11-13 DIAGNOSIS — I10 Essential (primary) hypertension: Secondary | ICD-10-CM

## 2016-11-13 DIAGNOSIS — D7589 Other specified diseases of blood and blood-forming organs: Secondary | ICD-10-CM | POA: Diagnosis not present

## 2016-11-13 DIAGNOSIS — E785 Hyperlipidemia, unspecified: Secondary | ICD-10-CM

## 2016-11-13 DIAGNOSIS — Z794 Long term (current) use of insulin: Secondary | ICD-10-CM

## 2016-11-13 MED ORDER — ZOSTER VAC RECOMB ADJUVANTED 50 MCG/0.5ML IM SUSR: 0.5000 mL | Freq: Once | INTRAMUSCULAR | 1 refills | Status: AC

## 2016-11-13 MED ORDER — GLYBURIDE-METFORMIN 2.5-500 MG PO TABS: 1.0000 | ORAL_TABLET | Freq: Every day | ORAL | 1 refills | Status: DC

## 2016-11-13 MED ORDER — LOSARTAN POTASSIUM-HCTZ 100-25 MG PO TABS: 1.0000 | ORAL_TABLET | Freq: Every day | ORAL | 1 refills | Status: DC

## 2016-11-13 NOTE — Progress Notes (Signed)
HPI: George Spence is a 66 y.o. male  who presents to [place] today, 11/13/16,  for chief complaint of:  Chief Complaint  Patient presents with  . Follow-up    BLOOD PRESSURE     HTN: BP elevated in office last visit - Slightly improved today after starting lisinopril/HCTZ, patient is currently on half tablet of this. Patient has not taken lisinopril in several months, said it "just didn't make me feel very good." Goal is decrease blood pressure slowly to avoid side effects. The patient is aware of risks of heart attack/stroke with prolonged elevated blood pressures. Patient stated he had home blood pressure cuff, did have arm cuff but this was a bit cumbersome, wrist cuff recently. These items were not with him in the office last visit for verification but he does bring them to the office today. No chest pain, pressure, shortness of breath.  Type 2 diabetes: Stable. We decreased dose of medications at last visit.   Hyperlipidemia: Stable, HDL is a bit low.  Macrocytosis without anemia: No previous CBC on file to compare. No history of anemia/B12 or folate deficiency   Past medical history, surgical history, social history and family history reviewed.  Patient Active Problem List   Diagnosis Date Noted  . 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 Essential hypertension 08/12/2014  . Hyperlipidemia 08/12/2014    Current medication list and allergy/intolerance information reviewed.   Current Outpatient Prescriptions on File Prior to Visit  Medication Sig Dispense Refill  . atorvastatin (LIPITOR) 20 MG tablet Take 1 tablet (20 mg total) by mouth daily. 90 tablet 1  . glyBURIDE-metformin (GLUCOVANCE) 2.5-500 MG tablet Take 2 tablets by mouth daily with breakfast. 180 tablet 1  . losartan-hydrochlorothiazide (HYZAAR) 100-25 MG tablet Take 0.5 tablets by mouth daily. 30 tablet 1   No current  facility-administered medications on file prior to visit.    No Known Allergies    Review of Systems:  Constitutional: No recent illness  HEENT: No  headache, no vision change  Cardiac: No  chest pain, No  pressure, No palpitations  Respiratory:  No  shortness of breath. No  Cough  Musculoskeletal: No new myalgia/arthralgia  Neurologic: No  weakness, No  Dizziness   Exam:  BP (!) 163/90   Pulse 84   Ht 6' (1.829 m)   Wt 282 lb (127.9 kg)   BMI 38.25 kg/m blood pressure measurement confirmed on manual recheck. 168/92 on wrist cuff, 165/100 on home manual cuff.   Constitutional: VS see above. General Appearance: alert, well-developed, well-nourished, NAD  Eyes: Normal lids and conjunctive, non-icteric sclera  Ears, Nose, Mouth, Throat: MMM, Normal external inspection ears/nares/mouth/lips/gums.  Neck: No masses, trachea midline.   Respiratory: Normal respiratory effort. no wheeze, no rhonchi, no rales  Cardiovascular: S1/S2 normal, no murmur, no rub/gallop auscultated. RRR.   Musculoskeletal: Gait normal. Symmetric and independent movement of all extremities  Neurological: Normal balance/coordination. No tremor.  Skin: warm, dry, intact.   Psychiatric: Normal judgment/insight. Normal mood and affect. Oriented x3.    Lab results reviewed in detail with the patient. Mild macrocytosis, cholesterol looks okay.  Recent Results (from the past 2160 hour(s))  POCT HgB A1C     Status: None   Collection Time: 10/30/16  9:59 AM  Result Value Ref Range   Hemoglobin A1C 5.4   CBC with Differential/Platelet     Status: Abnormal   Collection Time: 10/30/16 10:30 AM  Result  Value Ref Range   WBC 8.7 3.8 - 10.8 K/uL   RBC 4.59 4.20 - 5.80 MIL/uL   Hemoglobin 16.7 13.2 - 17.1 g/dL   HCT 47.0 38.5 - 50.0 %   MCV 102.4 (H) 80.0 - 100.0 fL   MCH 36.4 (H) 27.0 - 33.0 pg   MCHC 35.5 32.0 - 36.0 g/dL   RDW 14.7 11.0 - 15.0 %   Platelets 237 140 - 400 K/uL   MPV 9.7 7.5 -  12.5 fL   Neutro Abs 6,177 1,500 - 7,800 cells/uL   Lymphs Abs 1,392 850 - 3,900 cells/uL   Monocytes Absolute 870 200 - 950 cells/uL   Eosinophils Absolute 261 15 - 500 cells/uL   Basophils Absolute 0 0 - 200 cells/uL   Neutrophils Relative % 71 %   Lymphocytes Relative 16 %   Monocytes Relative 10 %   Eosinophils Relative 3 %   Basophils Relative 0 %   Smear Review Criteria for review not met   COMPLETE METABOLIC PANEL WITH GFR     Status: Abnormal   Collection Time: 10/30/16 10:30 AM  Result Value Ref Range   Sodium 141 135 - 146 mmol/L   Potassium 4.5 3.5 - 5.3 mmol/L   Chloride 102 98 - 110 mmol/L   CO2 30 20 - 31 mmol/L   Glucose, Bld 88 65 - 99 mg/dL   BUN 24 7 - 25 mg/dL   Creat 1.28 (H) 0.70 - 1.25 mg/dL    Comment:   For patients > or = 67 years of age: The upper reference limit for Creatinine is approximately 13% higher for people identified as African-American.      Total Bilirubin 0.9 0.2 - 1.2 mg/dL   Alkaline Phosphatase 53 40 - 115 U/L   AST 18 10 - 35 U/L   ALT 19 9 - 46 U/L   Total Protein 7.7 6.1 - 8.1 g/dL   Albumin 4.6 3.6 - 5.1 g/dL   Calcium 9.6 8.6 - 10.3 mg/dL   GFR, Est African American 67 >=60 mL/min   GFR, Est Non African American 58 (L) >=60 mL/min  Lipid panel     Status: Abnormal   Collection Time: 10/30/16 10:30 AM  Result Value Ref Range   Cholesterol 161 <200 mg/dL   Triglycerides 117 <150 mg/dL   HDL 35 (L) >40 mg/dL   Total CHOL/HDL Ratio 4.6 <5.0 Ratio   VLDL 23 <30 mg/dL   LDL Cholesterol 103 (H) <100 mg/dL  TSH     Status: None   Collection Time: 10/30/16 10:30 AM  Result Value Ref Range   TSH 4.16 0.40 - 4.50 mIU/L       ASSESSMENT/PLAN:   Advised can monitor home blood pressures, our goal for this time is 140/90 or less, keeping in mind ideally would be 130/80 however patient has a lot of concerns about side effects of blood pressure medications and his goal is to avoid these even in light of risks of elevated blood  pressures. Advised him that when he feels confident to increase the dose of the lisinopril/HCT he can go up to one full tablet  Essential hypertension - Advised can increase to 1 tablet lisinopril/HCTZ, as noted above patient is aware of risks of elevated blood pressure but his goal is to avoid side effects. - Plan: losartan-hydrochlorothiazide (HYZAAR) 100-25 MG tablet  Controlled type 2 diabetes mellitus without complication, with long-term current use of insulin (HCC) - Recheck A1c in 3 months on  decreased medications - Plan: glyBURIDE-metformin (GLUCOVANCE) 2.5-500 MG tablet  Hyperlipidemia, unspecified hyperlipidemia type  Macrocytosis without anemia - We'll follow up CBC in 3-6 months   Follow-up plan: Return in about 3 months (around 02/10/2017) for DIABETES FOLLOW-UP.  Visit summary with medication list and pertinent instructions was printed for patient to review, alert [place] if any changes needed. All questions at time of visit were answered - patient instructed to contact office with any additional concerns. ER/RTC precautions were reviewed with the patient and understanding verbalized.

## 2016-11-17 ENCOUNTER — Other Ambulatory Visit: Payer: Self-pay

## 2016-11-17 DIAGNOSIS — I10 Essential (primary) hypertension: Secondary | ICD-10-CM

## 2016-11-17 DIAGNOSIS — Z794 Long term (current) use of insulin: Principal | ICD-10-CM

## 2016-11-17 DIAGNOSIS — E119 Type 2 diabetes mellitus without complications: Secondary | ICD-10-CM

## 2016-11-17 MED ORDER — GLYBURIDE-METFORMIN 2.5-500 MG PO TABS: 1.0000 | ORAL_TABLET | Freq: Every day | ORAL | 3 refills | Status: DC

## 2016-11-17 MED ORDER — LOSARTAN POTASSIUM-HCTZ 100-25 MG PO TABS: 1.0000 | ORAL_TABLET | Freq: Every day | ORAL | 3 refills | Status: DC

## 2016-11-17 NOTE — Telephone Encounter (Signed)
Patient request a refill for Glyburide-metformin 2.5-500 mg and Losartan -hydrochlorothiazide 100-25 mg 90 day supply sent to [facility] home [facility]. Rhonda Cunningham,CMA

## 2016-11-23 ENCOUNTER — Telehealth: Payer: Self-pay | Admitting: Osteopathic Medicine

## 2016-11-24 NOTE — Telephone Encounter (Signed)
Medication clarification

## 2017-01-05 ENCOUNTER — Other Ambulatory Visit: Payer: Self-pay | Admitting: Osteopathic Medicine

## 2017-01-05 DIAGNOSIS — I10 Essential (primary) hypertension: Secondary | ICD-10-CM

## 2017-01-08 ENCOUNTER — Other Ambulatory Visit: Payer: Self-pay

## 2017-01-08 DIAGNOSIS — I10 Essential (primary) hypertension: Secondary | ICD-10-CM

## 2017-01-08 MED ORDER — LOSARTAN POTASSIUM-HCTZ 100-25 MG PO TABS: 0.5000 | ORAL_TABLET | Freq: Every day | ORAL | 0 refills | Status: DC

## 2017-02-12 ENCOUNTER — Encounter: Payer: Self-pay | Admitting: Osteopathic Medicine

## 2017-02-12 ENCOUNTER — Ambulatory Visit: Payer: Medicare HMO

## 2017-02-12 ENCOUNTER — Ambulatory Visit (INDEPENDENT_AMBULATORY_CARE_PROVIDER_SITE_OTHER): Payer: Medicare HMO | Admitting: Osteopathic Medicine

## 2017-02-12 VITALS — BP 155/95 | HR 88 | Ht 72.0 in | Wt 282.0 lb

## 2017-02-12 DIAGNOSIS — I10 Essential (primary) hypertension: Secondary | ICD-10-CM

## 2017-02-12 DIAGNOSIS — D7589 Other specified diseases of blood and blood-forming organs: Secondary | ICD-10-CM

## 2017-02-12 DIAGNOSIS — E119 Type 2 diabetes mellitus without complications: Secondary | ICD-10-CM | POA: Diagnosis not present

## 2017-02-12 DIAGNOSIS — N183 Chronic kidney disease, stage 3 unspecified: Secondary | ICD-10-CM | POA: Insufficient documentation

## 2017-02-12 LAB — CBC WITH DIFFERENTIAL/PLATELET
BASOS ABS: 0 {cells}/uL (ref 0–200)
BASOS PCT: 0 %
EOS PCT: 4 %
Eosinophils Absolute: 260 cells/uL (ref 15–500)
HCT: 44.9 % (ref 38.5–50.0)
HEMOGLOBIN: 15.6 g/dL (ref 13.2–17.1)
LYMPHS ABS: 1430 {cells}/uL (ref 850–3900)
Lymphocytes Relative: 22 %
MCH: 36.4 pg — ABNORMAL HIGH (ref 27.0–33.0)
MCHC: 34.7 g/dL (ref 32.0–36.0)
MCV: 104.7 fL — ABNORMAL HIGH (ref 80.0–100.0)
MONOS PCT: 10 %
MPV: 9.1 fL (ref 7.5–12.5)
Monocytes Absolute: 650 cells/uL (ref 200–950)
NEUTROS ABS: 4160 {cells}/uL (ref 1500–7800)
Neutrophils Relative %: 64 %
PLATELETS: 208 10*3/uL (ref 140–400)
RBC: 4.29 MIL/uL (ref 4.20–5.80)
RDW: 15.4 % — ABNORMAL HIGH (ref 11.0–15.0)
WBC: 6.5 10*3/uL (ref 3.8–10.8)

## 2017-02-12 LAB — POCT GLYCOSYLATED HEMOGLOBIN (HGB A1C): Hemoglobin A1C: 6.2

## 2017-02-12 MED ORDER — LOSARTAN POTASSIUM-HCTZ 50-12.5 MG PO TABS: 1.0000 | ORAL_TABLET | Freq: Every day | ORAL | 1 refills | Status: DC

## 2017-02-12 NOTE — Progress Notes (Signed)
HPI: George Spence is a 66 y.o. male  who presents to [facility] [place] today, 02/12/17,  for chief complaint of:  Chief Complaint  Patient presents with  . Follow-up    diabetes     HTN: BP elevated in office today. Recently restarted on Lisinopril-HCT initially half tablet With instructions to increase to whole tablet, however patient stated that when he took a whole tablet he felt a bit dizzy and does not want to go any further on the medications. We verified home monitor 11/2016 to within 10 mmHg but he has not been consistent about checking home blood pressure.    DIABETES SCREENING/PREVENTIVE CARE: Updated 02/12/17  A1C past 3-6 mos: Yes  controlled? Yes   10/2016: 5.4% --> went to half dose metformin   02/12/17: 6.2% --> OK to continue current regimen  BP goal <140/90: Nope LDL goal <100: Yes 04/2016,  No on most recent labs Eye exam annually: none on file, importance discussed with patient Foot exam: Pending Microalbuminuria:n/a on ACE Metformin: Yes  ACE/ARB: Yes  Antiplatelet if ASCVD Risk >10%: No  Statin: Yes  Pneumovax: Patient states he had pneumonia 23 shot at some point, can recall when that he thinks it was in this office, we don't have records. Flu vaccine: done 10/2016   Immunization History  Administered Date(s) Administered  . Influenza,inj,Quad PF,36+ Mos 10/30/2016  . Influenza-Unspecified 07/02/2014, 07/03/2015  . Pneumococcal Conjugate-13 10/30/2016         Past medical history, surgical history, social history and family history reviewed.  Patient Active Problem List   Diagnosis Date Noted  . Macrocytosis without anemia 11/13/2016  . Diabetes type 2, controlled (HCC) 08/13/2014  . Essential hypertension 08/12/2014  . Hyperlipidemia 08/12/2014    Current medication list and allergy/intolerance information reviewed.   Current Outpatient Prescriptions on File Prior to Visit  Medication Sig Dispense Refill  .  atorvastatin (LIPITOR) 20 MG tablet Take 1 tablet (20 mg total) by mouth daily. 90 tablet 1  . glyBURIDE-metformin (GLUCOVANCE) 2.5-500 MG tablet Take 1 tablet by mouth daily with breakfast. 180 tablet 3  . losartan-hydrochlorothiazide (HYZAAR) 100-25 MG tablet Take 0.5 tablets by mouth daily. 90 tablet 0   No current facility-administered medications on file prior to visit.    No Known Allergies    Review of Systems:  Constitutional: No recent illness  HEENT: No  headache, no vision change  Cardiac: No  chest pain, No  pressure, No palpitations  Respiratory:  No  shortness of breath. No  Cough  Neurologic: No  weakness, No  Dizziness   Exam:   BP (!) 155/95   Pulse 88   Ht 6' (1.829 m)   Wt 282 lb (127.9 kg)   BMI 38.25 kg/m    Constitutional: VS see above. General Appearance: alert, well-developed, well-nourished, NAD  Eyes: Normal lids and conjunctive, non-icteric sclera  Ears, Nose, Mouth, Throat: MMM, Normal external inspection ears/nares/mouth/lips/gums.  Neck: No masses, trachea midline.   Respiratory: Normal respiratory effort. no wheeze, no rhonchi, no rales  Cardiovascular: S1/S2 normal, no murmur, no rub/gallop auscultated. RRR.   Musculoskeletal: Gait normal. Symmetric and independent movement of all extremities  Neurological: Normal balance/coordination. No tremor.  Skin: warm, dry, intact.   Psychiatric: Normal judgment/insight. Normal mood and affect. Oriented x3.    Recent Results (from the past 2160 hour(s))  POCT HgB A1C     Status: None   Collection Time: 02/12/17 10:04 AM  Result Value Ref  Range   Hemoglobin A1C 6.2      Recent labs reviewed: Slightly decreased kidney function, consistent creatinine levels, diagnosis of chronic kidney disease likely due to hypertension/diabetes.   ASSESSMENT/PLAN:   Molli Knock to continue current dose of diabetes medications  Blood pressure is a concern - discussion of long-term cardiovascular risk of  having blood pressure in current range versus at goal, patient is willing to accept this increased risk and keep the medications at current dosing, risk of side effects of medications is more concerning to the patient. He has capacity to give informed consent and make his own decisions in this matter  Patient would like cholesterol and other labs rechecked  Diabetes mellitus without complication (HCC) - Plan: POCT HgB A1C  Essential hypertension - Plan: losartan-hydrochlorothiazide (HYZAAR) 50-12.5 MG tablet, CBC with Differential/Platelet, COMPLETE METABOLIC PANEL WITH GFR, Lipid panel  Macrocytosis without anemia - Plan: CBC with Differential/Platelet  Chronic kidney disease, stage III (moderate) - Plan: COMPLETE METABOLIC PANEL WITH GFR     Follow-up plan: Return in about 4 months (around 06/15/2017) for RECHECK DABETES AND BLOOD PRESSURE .  Visit summary with medication list and pertinent instructions was printed for patient to review, alert [place] if any changes needed. All questions at time of visit were answered - patient instructed to contact office with any additional concerns. ER/RTC precautions were reviewed with the patient and understanding verbalized.

## 2017-02-13 ENCOUNTER — Other Ambulatory Visit: Payer: Self-pay | Admitting: Osteopathic Medicine

## 2017-02-13 DIAGNOSIS — E538 Deficiency of other specified B group vitamins: Secondary | ICD-10-CM

## 2017-02-13 DIAGNOSIS — D7589 Other specified diseases of blood and blood-forming organs: Secondary | ICD-10-CM

## 2017-02-13 DIAGNOSIS — D510 Vitamin B12 deficiency anemia due to intrinsic factor deficiency: Secondary | ICD-10-CM

## 2017-02-13 LAB — COMPLETE METABOLIC PANEL WITH GFR
ALBUMIN: 4.4 g/dL (ref 3.6–5.1)
ALT: 23 U/L (ref 9–46)
AST: 21 U/L (ref 10–35)
Alkaline Phosphatase: 50 U/L (ref 40–115)
BILIRUBIN TOTAL: 0.9 mg/dL (ref 0.2–1.2)
BUN: 32 mg/dL — AB (ref 7–25)
CO2: 27 mmol/L (ref 20–31)
Calcium: 9.6 mg/dL (ref 8.6–10.3)
Chloride: 100 mmol/L (ref 98–110)
Creat: 1.7 mg/dL — ABNORMAL HIGH (ref 0.70–1.25)
GFR, EST NON AFRICAN AMERICAN: 41 mL/min — AB (ref >=60)
GFR, Est African American: 48 mL/min — ABNORMAL LOW (ref >=60)
Glucose, Bld: 152 mg/dL — ABNORMAL HIGH (ref 65–99)
Potassium: 4.5 mmol/L (ref 3.5–5.3)
SODIUM: 140 mmol/L (ref 135–146)
TOTAL PROTEIN: 7.4 g/dL (ref 6.1–8.1)

## 2017-02-13 LAB — LIPID PANEL
CHOLESTEROL: 176 mg/dL (ref <200)
HDL: 35 mg/dL — ABNORMAL LOW (ref >40)
LDL CALC: 111 mg/dL — AB (ref <100)
TRIGLYCERIDES: 148 mg/dL (ref <150)
Total CHOL/HDL Ratio: 5 Ratio — ABNORMAL HIGH (ref <5.0)
VLDL: 30 mg/dL (ref <30)

## 2017-02-13 MED ORDER — ATORVASTATIN CALCIUM 40 MG PO TABS: 40.0000 mg | ORAL_TABLET | Freq: Every day | ORAL | 3 refills | Status: DC

## 2017-02-13 NOTE — Addendum Note (Signed)
Addended by: Deirdre PippinsALEXANDER, Zanayah Shadowens M on: 02/13/2017 10:11 AM   Modules accepted: Orders

## 2017-02-14 LAB — FOLATE: FOLATE: 11.2 ng/mL (ref >5.4)

## 2017-02-14 LAB — VITAMIN B12: VITAMIN B 12: 101 pg/mL — AB (ref 200–1100)

## 2017-02-16 ENCOUNTER — Other Ambulatory Visit: Payer: Self-pay

## 2017-02-16 DIAGNOSIS — E538 Deficiency of other specified B group vitamins: Secondary | ICD-10-CM

## 2017-02-16 NOTE — Addendum Note (Signed)
Addended by: Deirdre PippinsALEXANDER, Mayjor Ager M on: 02/16/2017 08:33 AM   Modules accepted: Orders

## 2017-02-17 LAB — HOMOCYSTEINE: Homocysteine: 104.8 umol/L — ABNORMAL HIGH (ref <11.4)

## 2017-02-18 LAB — INTRINSIC FACTOR ANTIBODIES: INTRINSIC FACTOR: POSITIVE — AB

## 2017-02-20 ENCOUNTER — Other Ambulatory Visit: Payer: Self-pay | Admitting: Osteopathic Medicine

## 2017-02-20 DIAGNOSIS — D510 Vitamin B12 deficiency anemia due to intrinsic factor deficiency: Secondary | ICD-10-CM | POA: Insufficient documentation

## 2017-02-20 NOTE — Addendum Note (Signed)
Addended by: Deirdre PippinsALEXANDER, Nikhita Mentzel M on: 02/20/2017 02:13 PM   Modules accepted: Orders

## 2017-02-20 NOTE — Progress Notes (Signed)
Labs and follow up

## 2017-02-20 NOTE — Progress Notes (Signed)
See results note. 

## 2017-02-22 LAB — METHYLMALONIC ACID, SERUM: Methylmalonic Acid, Quant: 11700 nmol/L — ABNORMAL HIGH (ref 87–318)

## 2017-02-23 ENCOUNTER — Telehealth: Payer: Self-pay

## 2017-02-23 ENCOUNTER — Ambulatory Visit (INDEPENDENT_AMBULATORY_CARE_PROVIDER_SITE_OTHER): Payer: Medicare HMO | Admitting: Osteopathic Medicine

## 2017-02-23 VITALS — BP 159/74 | HR 72 | Wt 275.0 lb

## 2017-02-23 DIAGNOSIS — D510 Vitamin B12 deficiency anemia due to intrinsic factor deficiency: Secondary | ICD-10-CM | POA: Diagnosis not present

## 2017-02-23 MED ORDER — CYANOCOBALAMIN 1000 MCG/ML IJ SOLN
1000.0000 ug | Freq: Once | INTRAMUSCULAR | Status: AC
  Administered 2017-03-23: 1000 ug via INTRAMUSCULAR

## 2017-02-23 NOTE — Progress Notes (Signed)
Pt is here for a vitamin B-12 injection.  This is the first injection.  Given information regarding B-12.  Denies gastrointestinal problems or dizziness.  Pt tolerated injection well and without complications.  Injection given in right deltoid.  Pt advised to schedule appointment in 2 weeks.

## 2017-02-23 NOTE — Telephone Encounter (Signed)
For his condition, he will not absorb pills through the stomach. If he is comfortable giving himself injections, we can teach him to do these at his next nurse visit and I can prescribe the needed supplies for him to self-administer the medicine.

## 2017-02-23 NOTE — Telephone Encounter (Signed)
Pt stated that he plans to come in for his injections every 2 weeks until his appointment in July, was asking if he could take supplements by mouth as he travels to his vacation home often, and it may coincide with his time there.  Please advise.

## 2017-02-23 NOTE — Telephone Encounter (Signed)
I gave him the information, he is going to come in for these injections until his appointment, then will go from there.

## 2017-02-23 NOTE — Telephone Encounter (Signed)
Sounds good, thanks.

## 2017-03-12 ENCOUNTER — Ambulatory Visit (INDEPENDENT_AMBULATORY_CARE_PROVIDER_SITE_OTHER): Payer: Medicare HMO | Admitting: Osteopathic Medicine

## 2017-03-12 VITALS — BP 156/84 | HR 73

## 2017-03-12 DIAGNOSIS — D510 Vitamin B12 deficiency anemia due to intrinsic factor deficiency: Secondary | ICD-10-CM

## 2017-03-12 MED ORDER — CYANOCOBALAMIN 1000 MCG/ML IJ SOLN
1000.0000 ug | Freq: Once | INTRAMUSCULAR | Status: AC
  Administered 2017-03-12: 1000 ug via INTRAMUSCULAR

## 2017-03-12 NOTE — Progress Notes (Signed)
B12 injection given LD.  Advised pt to return in 2 weeks for next injection.

## 2017-03-23 ENCOUNTER — Ambulatory Visit (INDEPENDENT_AMBULATORY_CARE_PROVIDER_SITE_OTHER): Payer: Medicare HMO | Admitting: Osteopathic Medicine

## 2017-03-23 DIAGNOSIS — D510 Vitamin B12 deficiency anemia due to intrinsic factor deficiency: Secondary | ICD-10-CM

## 2017-03-23 MED ORDER — ATORVASTATIN CALCIUM 40 MG PO TABS: 40.0000 mg | ORAL_TABLET | Freq: Every day | ORAL | 3 refills | Status: DC

## 2017-03-23 NOTE — Progress Notes (Signed)
Pt here for his B12 injection. Injection given in RD, pt tolerated well. Pt advised to RTC in 2 wks for next injection, he already has an appointment made.Marland Kitchen.Marland Kitchen.George Spence, George Spence

## 2017-03-26 ENCOUNTER — Ambulatory Visit: Payer: Medicare HMO

## 2017-04-09 ENCOUNTER — Ambulatory Visit (INDEPENDENT_AMBULATORY_CARE_PROVIDER_SITE_OTHER): Payer: Medicare HMO | Admitting: Osteopathic Medicine

## 2017-04-09 VITALS — BP 133/81 | HR 71 | Wt 277.0 lb

## 2017-04-09 DIAGNOSIS — D510 Vitamin B12 deficiency anemia due to intrinsic factor deficiency: Secondary | ICD-10-CM | POA: Diagnosis not present

## 2017-04-09 MED ORDER — CYANOCOBALAMIN 1000 MCG/ML IJ SOLN
1000.0000 ug | Freq: Once | INTRAMUSCULAR | Status: AC
  Administered 2017-04-09: 1000 ug via INTRAMUSCULAR

## 2017-04-09 NOTE — Progress Notes (Signed)
Thanks for reviewing his need for labs!  He can get blood drawn next week. If B12 levels are good, we can do the injections every month.

## 2017-04-09 NOTE — Progress Notes (Signed)
Patient is here to have b12 injection. Patient is due for labs to have b12 rechecked. Offered to have him go downstairs BEFORE administering injection so that b12 levels would not be too elevated. The patient politely declines to go to lab today. The patient states he has a follow up appointment with Dr. Lyn HollingsheadAlexander and wanted to know if he should keep his appointment. I explained to him that the levels will be elevated if he goes today after injection and would be best to wait for 4 weeks after injection to have labs checked. Patient wanted to do this and will schedule an appointment 2 days later with alexander to go over results.

## 2017-04-10 ENCOUNTER — Telehealth: Payer: Self-pay | Admitting: *Deleted

## 2017-04-10 NOTE — Telephone Encounter (Signed)
Message left on vm with providers instructions regarding the B12

## 2017-04-16 DIAGNOSIS — D510 Vitamin B12 deficiency anemia due to intrinsic factor deficiency: Secondary | ICD-10-CM | POA: Diagnosis not present

## 2017-04-17 LAB — VITAMIN B12: Vitamin B-12: 421 pg/mL (ref 200–1100)

## 2017-04-18 ENCOUNTER — Ambulatory Visit (INDEPENDENT_AMBULATORY_CARE_PROVIDER_SITE_OTHER): Payer: Medicare HMO | Admitting: Osteopathic Medicine

## 2017-04-18 ENCOUNTER — Encounter: Payer: Self-pay | Admitting: Osteopathic Medicine

## 2017-04-18 VITALS — BP 138/84 | HR 83 | Wt 280.0 lb

## 2017-04-18 DIAGNOSIS — E119 Type 2 diabetes mellitus without complications: Secondary | ICD-10-CM | POA: Diagnosis not present

## 2017-04-18 DIAGNOSIS — Z794 Long term (current) use of insulin: Secondary | ICD-10-CM

## 2017-04-18 DIAGNOSIS — I10 Essential (primary) hypertension: Secondary | ICD-10-CM

## 2017-04-18 DIAGNOSIS — D510 Vitamin B12 deficiency anemia due to intrinsic factor deficiency: Secondary | ICD-10-CM

## 2017-04-18 DIAGNOSIS — B351 Tinea unguium: Secondary | ICD-10-CM | POA: Diagnosis not present

## 2017-04-18 DIAGNOSIS — Z23 Encounter for immunization: Secondary | ICD-10-CM

## 2017-04-18 LAB — POCT GLYCOSYLATED HEMOGLOBIN (HGB A1C): HEMOGLOBIN A1C: 6.7

## 2017-04-18 MED ORDER — LOSARTAN POTASSIUM-HCTZ 50-12.5 MG PO TABS: 0.5000 | ORAL_TABLET | Freq: Every day | ORAL | 1 refills | Status: DC

## 2017-04-18 MED ORDER — B-12 1000 MCG PO CAPS: 1000.0000 ug | ORAL_CAPSULE | Freq: Every day | ORAL | 3 refills | Status: DC

## 2017-04-18 MED ORDER — CICLOPIROX 8 % EX SOLN: Freq: Every day | CUTANEOUS | 6 refills | Status: DC

## 2017-04-18 MED ORDER — GLYBURIDE-METFORMIN 2.5-500 MG PO TABS: 2.0000 | ORAL_TABLET | Freq: Two times a day (BID) | ORAL | 3 refills | Status: DC

## 2017-04-18 NOTE — Progress Notes (Signed)
HPI: George Spence is a 66 y.o. male  who presents to [facility] today, 04/18/17,  for chief complaint of:  Chief Complaint  Patient presents with  . Diabetes     Pernicious Anemia: Has been on B12 injections and doing well. Levels have been normal on recheck   HTN: BP better on recent nurse visit for B12 injections.  We verified home monitor 11/2016 to within 10 mmHg but he has not been consistent about checking home blood pressure.  Onychomycosis: concern for toenail fungus. OTC treatments not helping    DIABETES SCREENING/PREVENTIVE CARE: Updated 04/18/17  A1C past 3-6 mos: Yes  controlled? Yes   10/2016: 5.4% --> went to half dose metformin   02/12/17: 6.2% --> OK to continue current regimen  04/18/17: 6.7% -->  On Glyburide-Metformin 2.5-500 daily, will go back to higher dose  BP goal <130/80: not today!  LDL goal <100: Yes 04/2016,  No on most recent labs Eye exam annually: none on file, importance discussed with patient Foot exam: 04/18/17  Microalbuminuria:n/a on ACE Metformin: Yes  ACE/ARB: Yes  Antiplatelet if ASCVD Risk >10%: No  Statin: Yes  Pneumovax: Patient states he had pneumonia 23 shot at some point, can recall when that he thinks it was in this office, we don't have records. Flu vaccine: done 10/2016   Immunization History  Administered Date(s) Administered  . Influenza,inj,Quad PF,36+ Mos 10/30/2016  . Influenza-Unspecified 07/02/2014, 07/03/2015  . Pneumococcal Conjugate-13 10/30/2016  . Zoster 10/15/2015      Past medical history, surgical history, social history and family history reviewed.  Patient Active Problem List   Diagnosis Date Noted  . Pernicious anemia 02/20/2017  . Chronic kidney disease, stage III (moderate) 02/12/2017  . Macrocytosis without anemia 11/13/2016  . Diabetes type 2, controlled ([place]) 08/13/2014  . Essential hypertension 08/12/2014  . Hyperlipidemia 08/12/2014    Current  medication list and allergy/intolerance information reviewed.   Current Outpatient Prescriptions on File Prior to Visit  Medication Sig Dispense Refill  . atorvastatin (LIPITOR) 40 MG tablet Take 1 tablet (40 mg total) by mouth daily. 90 tablet 3  . losartan-hydrochlorothiazide (HYZAAR) 50-12.5 MG tablet Take 1 tablet by mouth daily. 90 tablet 1   No current facility-administered medications on file prior to visit.    No Known Allergies    Review of Systems:  Constitutional: No recent illness  HEENT: No  headache, no vision change  Cardiac: No  chest pain, No  pressure, No palpitations  Respiratory:  No  shortness of breath. No  Cough  Neurologic: No  weakness, No  Dizziness   Exam:   BP 138/84   Pulse 83   Wt 280 lb (127 kg)   SpO2 96%   BMI 37.97 kg/m    Constitutional: VS see above. General Appearance: alert, well-developed, well-nourished, NAD  Eyes: Normal lids and conjunctive, non-icteric sclera  Ears, Nose, Mouth, Throat: MMM, Normal external inspection ears/nares/mouth/lips/gums.  Neck: No masses, trachea midline.   Respiratory: Normal respiratory effort. no wheeze, no rhonchi, no rales  Cardiovascular: S1/S2 normal, no murmur, no rub/gallop auscultated. RRR.   Musculoskeletal: Gait normal. Symmetric and independent movement of all extremities  Neurological: Normal balance/coordination. No tremor.  Skin: warm, dry, intact.   Psychiatric: Normal judgment/insight. Normal mood and affect. Oriented x3.    Recent Results (from the past 2160 hour(s))  POCT HgB A1C     Status: None   Collection Time: 02/12/17 10:04 AM  Result Value  Ref Range   Hemoglobin A1C 6.2   CBC with Differential/Platelet     Status: Abnormal   Collection Time: 02/12/17  1:24 PM  Result Value Ref Range   WBC 6.5 3.8 - 10.8 K/uL   RBC 4.29 4.20 - 5.80 MIL/uL   Hemoglobin 15.6 13.2 - 17.1 g/dL   HCT 44.9 38.5 - 50.0 %   MCV 104.7 (H) 80.0 - 100.0 fL   MCH 36.4 (H) 27.0 - 33.0 pg    MCHC 34.7 32.0 - 36.0 g/dL   RDW 15.4 (H) 11.0 - 15.0 %   Platelets 208 140 - 400 K/uL   MPV 9.1 7.5 - 12.5 fL   Neutro Abs 4,160 1,500 - 7,800 cells/uL   Lymphs Abs 1,430 850 - 3,900 cells/uL   Monocytes Absolute 650 200 - 950 cells/uL   Eosinophils Absolute 260 15 - 500 cells/uL   Basophils Absolute 0 0 - 200 cells/uL   Neutrophils Relative % 64 %   Lymphocytes Relative 22 %   Monocytes Relative 10 %   Eosinophils Relative 4 %   Basophils Relative 0 %   Smear Review Criteria for review not met   COMPLETE METABOLIC PANEL WITH GFR     Status: Abnormal   Collection Time: 02/12/17  1:24 PM  Result Value Ref Range   Sodium 140 135 - 146 mmol/L   Potassium 4.5 3.5 - 5.3 mmol/L   Chloride 100 98 - 110 mmol/L   CO2 27 20 - 31 mmol/L   Glucose, Bld 152 (H) 65 - 99 mg/dL   BUN 32 (H) 7 - 25 mg/dL   Creat 1.70 (H) 0.70 - 1.25 mg/dL    Comment:   For patients > or = 67 years of age: The upper reference limit for Creatinine is approximately 13% higher for people identified as African-American.      Total Bilirubin 0.9 0.2 - 1.2 mg/dL   Alkaline Phosphatase 50 40 - 115 U/L   AST 21 10 - 35 U/L   ALT 23 9 - 46 U/L   Total Protein 7.4 6.1 - 8.1 g/dL   Albumin 4.4 3.6 - 5.1 g/dL   Calcium 9.6 8.6 - 10.3 mg/dL   GFR, Est African American 48 (L) >=60 mL/min   GFR, Est Non African American 41 (L) >=60 mL/min  Lipid panel     Status: Abnormal   Collection Time: 02/12/17  1:24 PM  Result Value Ref Range   Cholesterol 176 <200 mg/dL   Triglycerides 148 <150 mg/dL   HDL 35 (L) >40 mg/dL   Total CHOL/HDL Ratio 5.0 (H) <5.0 Ratio   VLDL 30 <30 mg/dL   LDL Cholesterol 111 (H) <100 mg/dL  Folate     Status: None   Collection Time: 02/13/17 10:11 AM  Result Value Ref Range   Folate 11.2 >5.4 ng/mL    Comment: Reference Range >17 years:   Low: <3.4 ng/mL              Borderline: 3.4-5.4 ng/mL              Normal: >5.4 ng/mL     Vitamin B12     Status: Abnormal   Collection Time:  02/13/17 10:11 AM  Result Value Ref Range   Vitamin B-12 101 (L) 200 - 1,100 pg/mL  Homocysteine     Status: Abnormal   Collection Time: 02/16/17 11:38 AM  Result Value Ref Range   Homocysteine 104.8 (H) <11.4 umol/L    Comment:  Result repeated and verified. Result confirmed by automatic dilution. Homocysteine is increased by functional deficiency of folate or vitamin B12.  Testing for methylmalonic acid differentiates between these deficiencies.  Other causes of increased homocysteine include renal failure, folate antagonists such as methotrexate and phenytoin, and exposure to nitrous oxide.   Methylmalonic Acid     Status: Abnormal   Collection Time: 02/16/17 11:38 AM  Result Value Ref Range   Methylmalonic Acid, Quant 11,700 (H) 87 - 318 nmol/L    Comment: Assay was repeated and verified.  Intrinsic Factor Antibodies     Status: Abnormal   Collection Time: 02/16/17 11:38 AM  Result Value Ref Range   Intrinsic Factor Positive (A) Negative    Comment: For additional information, please refer to http://education.questdiagnostics.com/faq/IFAB (This link is being provided for informational/  educational purposes only.)   Vitamin B12     Status: None   Collection Time: 04/16/17 10:00 AM  Result Value Ref Range   Vitamin B-12 421 200 - 1,100 pg/mL       ASSESSMENT/PLAN:   Increase dose of diabetes medications, hypoglycemia cautions reviewed   Controlled type 2 diabetes mellitus without complication, with long-term current use of insulin ([place]) - Plan: POCT glycosylated hemoglobin (Hb A1C), glyBURIDE-metformin (GLUCOVANCE) 2.5-500 MG tablet  Need for tetanus, diphtheria, and acellular pertussis (Tdap) vaccine - Plan: Tdap vaccine greater than or equal to 7yo IM  Controlled type 2 diabetes mellitus without complication, with long-term current use of insulin (HCC) - Recheck A1c in 3 months on decreased medications - Plan: POCT glycosylated hemoglobin (Hb A1C), glyBURIDE-metformin  (GLUCOVANCE) 2.5-500 MG tablet  Pernicious anemia - Plan: Cyanocobalamin (B-12) 1000 MCG CAPS  Onychomycosis - Plan: ciclopirox (PENLAC) 8 % solution  Essential hypertension     Follow-up plan: Return in about 3 months (around 07/19/2017) for recheck diabetes and B12, sooner if needed .  Visit summary with medication list and pertinent instructions was printed for patient to review, alert [place] if any changes needed. All questions at time of visit were answered - patient instructed to contact office with any additional concerns. ER/RTC precautions were reviewed with the patient and understanding verbalized.

## 2017-04-23 ENCOUNTER — Ambulatory Visit: Payer: Medicare HMO | Admitting: Osteopathic Medicine

## 2017-05-10 ENCOUNTER — Ambulatory Visit: Payer: Medicare HMO | Admitting: Osteopathic Medicine

## 2017-06-21 ENCOUNTER — Other Ambulatory Visit: Payer: Self-pay

## 2017-06-21 DIAGNOSIS — E119 Type 2 diabetes mellitus without complications: Secondary | ICD-10-CM

## 2017-06-21 DIAGNOSIS — Z794 Long term (current) use of insulin: Principal | ICD-10-CM

## 2017-06-21 MED ORDER — GLYBURIDE-METFORMIN 2.5-500 MG PO TABS: 2.0000 | ORAL_TABLET | Freq: Two times a day (BID) | ORAL | 3 refills | Status: DC

## 2017-07-02 ENCOUNTER — Ambulatory Visit: Payer: Medicare HMO | Admitting: Osteopathic Medicine

## 2017-07-16 ENCOUNTER — Telehealth: Payer: Self-pay | Admitting: Osteopathic Medicine

## 2017-07-16 DIAGNOSIS — E782 Mixed hyperlipidemia: Secondary | ICD-10-CM

## 2017-07-16 DIAGNOSIS — D510 Vitamin B12 deficiency anemia due to intrinsic factor deficiency: Secondary | ICD-10-CM

## 2017-07-16 NOTE — Telephone Encounter (Signed)
Patient called adv he has an appointment on Thurs 10/18 and is requesting to know if he needs to get blood work for B-12 and Cholesterol labs today or tomorrow before his appointment. Pt req a call bac to know when he can go. [phone]. Thanks

## 2017-07-16 NOTE — Telephone Encounter (Signed)
Orders are in, he can go to the lab directly at his convenience

## 2017-07-16 NOTE — Telephone Encounter (Signed)
Notified patient via VM.

## 2017-07-16 NOTE — Telephone Encounter (Signed)
Please see note below. George Spence,CMA  

## 2017-07-17 DIAGNOSIS — D510 Vitamin B12 deficiency anemia due to intrinsic factor deficiency: Secondary | ICD-10-CM | POA: Diagnosis not present

## 2017-07-17 DIAGNOSIS — E782 Mixed hyperlipidemia: Secondary | ICD-10-CM | POA: Diagnosis not present

## 2017-07-18 LAB — LIPID PANEL
CHOLESTEROL: 112 mg/dL (ref <200)
HDL: 27 mg/dL — AB (ref >40)
LDL Cholesterol (Calc): 65 mg/dL (calc)
Non-HDL Cholesterol (Calc): 85 mg/dL (calc) (ref <130)
TRIGLYCERIDES: 110 mg/dL (ref <150)
Total CHOL/HDL Ratio: 4.1 (calc) (ref <5.0)

## 2017-07-18 LAB — VITAMIN B12: Vitamin B-12: 517 pg/mL (ref 200–1100)

## 2017-07-19 ENCOUNTER — Encounter: Payer: Self-pay | Admitting: Osteopathic Medicine

## 2017-07-19 ENCOUNTER — Ambulatory Visit (INDEPENDENT_AMBULATORY_CARE_PROVIDER_SITE_OTHER): Payer: Medicare HMO | Admitting: Osteopathic Medicine

## 2017-07-19 VITALS — BP 150/77 | HR 64 | Ht 72.0 in | Wt 280.0 lb

## 2017-07-19 DIAGNOSIS — Z23 Encounter for immunization: Secondary | ICD-10-CM

## 2017-07-19 DIAGNOSIS — B351 Tinea unguium: Secondary | ICD-10-CM

## 2017-07-19 DIAGNOSIS — D510 Vitamin B12 deficiency anemia due to intrinsic factor deficiency: Secondary | ICD-10-CM | POA: Diagnosis not present

## 2017-07-19 DIAGNOSIS — E119 Type 2 diabetes mellitus without complications: Secondary | ICD-10-CM

## 2017-07-19 DIAGNOSIS — I10 Essential (primary) hypertension: Secondary | ICD-10-CM | POA: Diagnosis not present

## 2017-07-19 DIAGNOSIS — E782 Mixed hyperlipidemia: Secondary | ICD-10-CM

## 2017-07-19 LAB — POCT GLYCOSYLATED HEMOGLOBIN (HGB A1C): HEMOGLOBIN A1C: 6.1

## 2017-07-19 MED ORDER — ECONAZOLE NITRATE 1 % EX CREA: TOPICAL_CREAM | Freq: Every day | CUTANEOUS | 2 refills | Status: DC

## 2017-07-19 MED ORDER — LOSARTAN POTASSIUM-HCTZ 50-12.5 MG PO TABS: 1.0000 | ORAL_TABLET | Freq: Every day | ORAL | 1 refills | Status: DC

## 2017-07-19 NOTE — Progress Notes (Signed)
HPI: George Spence is a 66 y.o. male  who presents to [facility] today, 07/19/17,  for chief complaint of:  Chief Complaint  Patient presents with  . Follow-up    DIABETES    Pernicious Anemia: Has been on B12 injections and doing well. Levels have been normal on recheck, we transitioned to oral meds and levels are ok   HTN: We verified home monitor 11/2016 to within 10 mmHg but he has not been consistent about checking home blood pressure.  Onychomycosis: concern for toenail fungus. OTC treatments not helping so we Rx Ciclopirox. This was too expensive, he is requesting Rx for    DIABETES SCREENING/PREVENTIVE CARE: Updated 07/19/17  A1C past 3-6 mos: Yes  controlled? Yes   10/2016: 5.4% --> went to half dose metformin   02/12/17: 6.2% --> OK to continue current regimen  04/18/17: 6.7% -->  On Glyburide-Metformin 2.5-500 daily, will go back to higher dose   07/19/17: 6.1% --> yay!  BP goal <130/80: not today!  LDL goal <100: Yes 04/2016 and better now at 64 Eye exam annually: none on file, importance discussed with patient Foot exam: 04/18/17  Microalbuminuria:n/a on ACE Metformin: Yes  ACE/ARB: Yes  Antiplatelet if ASCVD Risk >10%: No  Statin: Yes  Pneumovax: Patient states he had pneumonia 23 shot at some point, cannot recall when but he thinks it was in this office, we don't have records. Flu vaccine: done 10/2016   Immunization History  Administered Date(s) Administered  . Influenza,inj,Quad PF,6+ Mos 10/30/2016  . Influenza-Unspecified 07/02/2014, 07/03/2015  . Pneumococcal Conjugate-13 10/30/2016  . Tdap 04/18/2017  . Zoster 10/15/2015      Past medical history, surgical history, social history and family history reviewed.  Patient Active Problem List   Diagnosis Date Noted  . Pernicious anemia 02/20/2017  . Chronic kidney disease, stage III (moderate) (HCC) 02/12/2017  . Macrocytosis without anemia 11/13/2016  .  Diabetes type 2, controlled (HCC) 08/13/2014  . Essential hypertension 08/12/2014  . Hyperlipidemia 08/12/2014    Current medication list and allergy/intolerance information reviewed.   Current Outpatient Prescriptions on File Prior to Visit  Medication Sig Dispense Refill  . atorvastatin (LIPITOR) 40 MG tablet Take 1 tablet (40 mg total) by mouth daily. 90 tablet 3  . ciclopirox (PENLAC) 8 % solution Apply topically at bedtime. Apply over nail and surrounding skin. Apply daily over previous coat. After seven (7) days, may remove with alcohol and continue cycle. 6.6 mL 6  . Cyanocobalamin (B-12) 1000 MCG CAPS Take 1,000 mcg by mouth daily. 90 capsule 3  . glyBURIDE-metformin (GLUCOVANCE) 2.5-500 MG tablet Take 2 tablets by mouth 2 (two) times daily with a meal. 360 tablet 3  . losartan-hydrochlorothiazide (HYZAAR) 50-12.5 MG tablet Take 0.5 tablets by mouth daily. 90 tablet 1   No current facility-administered medications on file prior to visit.    No Known Allergies    Review of Systems:  Constitutional: No recent illness  HEENT: No  headache, no vision change  Cardiac: No  chest pain, No  pressure, No palpitations  Respiratory:  No  shortness of breath. No  Cough  Neurologic: No  weakness, No  Dizziness   Exam:  BP (!) 150/77   Pulse 64   Ht 6' (1.829 m)   Wt 280 lb (127 kg)   BMI 37.97 kg/m    Constitutional: VS see above. General Appearance: alert, well-developed, well-nourished, NAD  Eyes: Normal lids and conjunctive, non-icteric sclera  Ears, Nose, Mouth, Throat: MMM, Normal external inspection ears/nares/mouth/lips/gums.  Neck: No masses, trachea midline.   Respiratory: Normal respiratory effort. no wheeze, no rhonchi, no rales  Cardiovascular: S1/S2 normal, no murmur, no rub/gallop auscultated. RRR.   Musculoskeletal: Gait normal. Symmetric and independent movement of all extremities  Neurological: Normal balance/coordination. No tremor.  Skin: warm,  dry, intact.   Psychiatric: Normal judgment/insight. Normal mood and affect. Oriented x3.    Recent Results (from the past 2160 hour(s))  Vitamin B12     Status: None   Collection Time: 07/17/17 11:41 AM  Result Value Ref Range   Vitamin B-12 517 200 - 1,100 pg/mL  Lipid panel     Status: Abnormal   Collection Time: 07/17/17 11:41 AM  Result Value Ref Range   Cholesterol 112 <200 mg/dL   HDL 27 (L) >78>40 mg/dL   Triglycerides 295110 <621<150 mg/dL   LDL Cholesterol (Calc) 65 mg/dL (calc)    Comment: Reference range: <100 . Desirable range <100 mg/dL for primary prevention;   <70 mg/dL for patients with CHD or diabetic patients  with > or = 2 CHD risk factors. Marland Kitchen. LDL-C is now calculated using the Martin-Hopkins  calculation, which is a validated novel method providing  better accuracy than the Friedewald equation in the  estimation of LDL-C.  Horald PollenMartin SS et al. Lenox AhrJAMA. 3086;578(462013;310(19): 2061-2068  (http://education.[facility].com/faq/FAQ164)    Total CHOL/HDL Ratio 4.1 <5.0 (calc)   Non-HDL Cholesterol (Calc) 85 <962<130 mg/dL (calc)    Comment: For patients with diabetes plus 1 major ASCVD risk  factor, treating to a non-HDL-C goal of <100 mg/dL  (LDL-C of <95<70 mg/dL) is considered a therapeutic  option.        ASSESSMENT/PLAN:   Diabetes mellitus without complication (HCC) - A1C better! Continue current meds - Plan: POCT HgB A1C  Need for immunization against influenza - Plan: Flu Vaccine QUAD 36+ mos IM  Pernicious anemia - B12 levels look good on po supplementation   Onychomycosis - declined coupon for ciclopiron, Rx sent for econazole which pt states has worked for him in the past.   Mixed hyperlipidemia - LDL is under goal for DM2!   Essential hypertension - Plan: losartan-hydrochlorothiazide (HYZAAR) 50-12.5 MG tablet     Follow-up plan: Return in about 3 months (around 10/19/2017) for recheck sugars and BP, 2 week nurse visit BP check on higher meds  . OK to refill  though until Jan 19 if pt desires   Visit summary with medication list and pertinent instructions was printed for patient to review, alert us if any changes needed. All questions at time of visit were answered - patient instructed to contact office with any additional concerns. ER/RTC precautions were reviewed with the patient and understanding verbalized.

## 2017-10-22 ENCOUNTER — Telehealth: Payer: Self-pay | Admitting: Osteopathic Medicine

## 2017-10-22 NOTE — Telephone Encounter (Signed)
He does not need blood work done ahead of time. We will be checking an A1c here in the office at his visit, he does not have to come fasting.

## 2017-10-22 NOTE — Telephone Encounter (Signed)
Pt called and wanted to know if he is due for blood work because he has an appointment with Dr, Lyn HollingsheadAlexander on Jan.28th and if he needs blood work he would like to go ahead and have those orders put in so he can get them done and go over the  results at his appointment. Thanks

## 2017-10-24 NOTE — Telephone Encounter (Signed)
Left message advising of recommendations.  

## 2017-10-29 ENCOUNTER — Encounter: Payer: Self-pay | Admitting: Osteopathic Medicine

## 2017-10-29 ENCOUNTER — Ambulatory Visit (INDEPENDENT_AMBULATORY_CARE_PROVIDER_SITE_OTHER): Payer: Medicare HMO | Admitting: Osteopathic Medicine

## 2017-10-29 VITALS — BP 145/85 | HR 80 | Wt 287.1 lb

## 2017-10-29 DIAGNOSIS — D510 Vitamin B12 deficiency anemia due to intrinsic factor deficiency: Secondary | ICD-10-CM

## 2017-10-29 DIAGNOSIS — E1121 Type 2 diabetes mellitus with diabetic nephropathy: Secondary | ICD-10-CM

## 2017-10-29 DIAGNOSIS — I10 Essential (primary) hypertension: Secondary | ICD-10-CM | POA: Diagnosis not present

## 2017-10-29 DIAGNOSIS — E782 Mixed hyperlipidemia: Secondary | ICD-10-CM

## 2017-10-29 LAB — POCT GLYCOSYLATED HEMOGLOBIN (HGB A1C): Hemoglobin A1C: 5.9

## 2017-10-29 MED ORDER — SILDENAFIL CITRATE 20 MG PO TABS: 20.0000 mg | ORAL_TABLET | ORAL | 11 refills | Status: DC | PRN

## 2017-10-29 NOTE — Progress Notes (Signed)
HPI: George Spence is a 67 y.o. male  who presents to [facility] [place] today, 10/29/17,  for chief complaint of:  No chief complaint on file.   Pernicious Anemia: Has been on B12 injections and doing well. Levels have been normal on recheck, we transitioned to oral meds and levels have been ok, last checked 07/2017  HTN: We verified home monitor 11/2016 to within 10 mmHg but he has not been consistent about checking home blood pressure. BP high on intake.   ED: requests generic viagra to use as needed.    DIABETES SCREENING/PREVENTIVE CARE: Updated 10/29/17  A1C past 3-6 mos: Yes  controlled? Yes   10/2016: 5.4% --> went to half dose metformin   02/12/17: 6.2% --> OK to continue current regimen  04/18/17: 6.7% -->  On Glyburide-Metformin 2.5-500 daily, will go back to higher dose   07/19/17: 6.1% --> yay!   10/29/17: 5.9% --? George Spence! BP goal <130/80: not today!  LDL goal <100: Yes 04/2016 and better now at 64 Eye exam annually: none on file, importance discussed with patient Foot exam: 04/18/17  Microalbuminuria:n/a on ACE Metformin: Yes  ACE/ARB: Yes  Antiplatelet if ASCVD Risk >10%: No  Statin: Yes  Pneumovax: Patient states he had pneumonia-23 shot at some point, cannot recall when but he thinks it was in this office, we don't have records. Flu vaccine: done 10/2016   Immunization History  Administered Date(s) Administered  . Influenza,inj,Quad PF,6+ Mos 10/30/2016, 07/19/2017  . Influenza-Unspecified 07/02/2014, 07/03/2015  . Pneumococcal Conjugate-13 10/30/2016  . Tdap 04/18/2017  . Zoster 10/15/2015      Past medical history, surgical history, social history and family history reviewed.  Patient Active Problem List   Diagnosis Date Noted  . Pernicious anemia 02/20/2017  . Chronic kidney disease, stage III (moderate) (HCC) 02/12/2017  . Macrocytosis without anemia 11/13/2016  . Diabetes type 2, controlled (HCC) 08/13/2014  .  Essential hypertension 08/12/2014  . Hyperlipidemia 08/12/2014    Current medication list and allergy/intolerance information reviewed.   Current Outpatient Medications on File Prior to Visit  Medication Sig Dispense Refill  . atorvastatin (LIPITOR) 40 MG tablet Take 1 tablet (40 mg total) by mouth daily. 90 tablet 3  . Cyanocobalamin (B-12) 1000 MCG CAPS Take 1,000 mcg by mouth daily. 90 capsule 3  . econazole nitrate 1 % cream Apply topically daily. To nail fungal infection 85 g 2  . glyBURIDE-metformin (GLUCOVANCE) 2.5-500 MG tablet Take 2 tablets by mouth 2 (two) times daily with a meal. 360 tablet 3  . losartan-hydrochlorothiazide (HYZAAR) 50-12.5 MG tablet Take 1 tablet by mouth daily. 90 tablet 1   No current facility-administered medications on file prior to visit.    No Known Allergies    Review of Systems:  Constitutional: No recent illness  HEENT: No  headache, no vision change  Cardiac: No  chest pain, No  pressure, No palpitations  Respiratory:  No  shortness of breath. No  Cough  Neurologic: No  weakness, No  Dizziness   Exam:  BP (!) 145/85   Pulse 80   Wt 287 lb 1.9 oz (130.2 kg)   BMI 38.94 kg/m    Constitutional: VS see above. General Appearance: alert, well-developed, well-nourished, NAD  Eyes: Normal lids and conjunctive, non-icteric sclera  Ears, Nose, Mouth, Throat: MMM, Normal external inspection ears/nares/mouth/lips/gums.  Neck: No masses, trachea midline.   Respiratory: Normal respiratory effort. no wheeze, no rhonchi, no rales  Cardiovascular: S1/S2 normal, no murmur,  no rub/gallop auscultated. RRR.   Musculoskeletal: Gait normal. Symmetric and independent movement of all extremities  Neurological: Normal balance/coordination. No tremor.  Skin: warm, dry, intact.   Psychiatric: Normal judgment/insight. Normal mood and affect. Oriented x3.   Results for orders placed or performed in visit on 10/29/17 (from the past 24 hour(s))  POCT  HgB A1C     Status: None   Collection Time: 10/29/17  8:24 AM  Result Value Ref Range   Hemoglobin A1C 5.9       ASSESSMENT/PLAN:   Controlled type 2 diabetes mellitus with diabetic nephropathy, without long-term current use of insulin (HCC) - Plan: POCT HgB A1C, COMPLETE METABOLIC PANEL WITH GFR, Lipid panel, Hemoglobin A1c  Pernicious anemia - Plan: Vitamin B12, CBC  Mixed hyperlipidemia - Plan: COMPLETE METABOLIC PANEL WITH GFR, Lipid panel  Essential hypertension - Plan: COMPLETE METABOLIC PANEL WITH GFR, Lipid panel, CBC     Follow-up plan: Return in about 3 months (around 01/27/2018) for recheck labs and sugars, get blood drawn about a week prior to apppoiontment .   Visit summary with medication list and pertinent instructions was printed for patient to review, alert us if any changes needed. All questions at time of visit were answered - patient instructed to contact office with any additional concerns. ER/RTC precautions were reviewed with the patient and understanding verbalized.

## 2017-10-29 NOTE — Patient Instructions (Signed)
[facility] [place] Located in: [place] [facility] [place] [address] Address: [address], [place], [place] [address] Phone: [phone]

## 2017-11-08 ENCOUNTER — Other Ambulatory Visit: Payer: Self-pay | Admitting: Osteopathic Medicine

## 2017-11-08 DIAGNOSIS — I10 Essential (primary) hypertension: Secondary | ICD-10-CM

## 2017-11-22 ENCOUNTER — Other Ambulatory Visit: Payer: Self-pay

## 2017-11-22 ENCOUNTER — Telehealth: Payer: Self-pay

## 2017-11-22 DIAGNOSIS — I10 Essential (primary) hypertension: Secondary | ICD-10-CM

## 2017-11-22 MED ORDER — LOSARTAN POTASSIUM-HCTZ 50-12.5 MG PO TABS: 1.0000 | ORAL_TABLET | Freq: Every day | ORAL | 0 refills | Status: DC

## 2017-11-22 NOTE — Telephone Encounter (Signed)
Pt called stating he rec'd a shipment today for glyburide-metformin from [facility] with the sig as 1 tab daily. [employer], spoke to Rep Totah VistaShelly who informed me that they had an old script on file & was not aware of change. Correct sig information was provided. Pt will be due to receive shipment after 11/25/17. Per pt, all maintenance [facility]. Tried calling pt, no answer. Left a vm msg with update.

## 2018-01-31 ENCOUNTER — Other Ambulatory Visit: Payer: Self-pay | Admitting: Osteopathic Medicine

## 2018-01-31 DIAGNOSIS — Z794 Long term (current) use of insulin: Principal | ICD-10-CM

## 2018-01-31 DIAGNOSIS — E119 Type 2 diabetes mellitus without complications: Secondary | ICD-10-CM

## 2018-02-04 ENCOUNTER — Ambulatory Visit: Payer: Medicare HMO | Admitting: Osteopathic Medicine

## 2018-02-05 ENCOUNTER — Other Ambulatory Visit: Payer: Self-pay

## 2018-02-05 DIAGNOSIS — D510 Vitamin B12 deficiency anemia due to intrinsic factor deficiency: Secondary | ICD-10-CM

## 2018-02-07 DIAGNOSIS — D510 Vitamin B12 deficiency anemia due to intrinsic factor deficiency: Secondary | ICD-10-CM | POA: Diagnosis not present

## 2018-02-07 DIAGNOSIS — I10 Essential (primary) hypertension: Secondary | ICD-10-CM | POA: Diagnosis not present

## 2018-02-07 DIAGNOSIS — E782 Mixed hyperlipidemia: Secondary | ICD-10-CM | POA: Diagnosis not present

## 2018-02-07 DIAGNOSIS — E1121 Type 2 diabetes mellitus with diabetic nephropathy: Secondary | ICD-10-CM | POA: Diagnosis not present

## 2018-02-07 LAB — CBC
HEMATOCRIT: 40.2 % (ref 38.5–50.0)
HEMOGLOBIN: 14.1 g/dL (ref 13.2–17.1)
MCH: 30.8 pg (ref 27.0–33.0)
MCHC: 35.1 g/dL (ref 32.0–36.0)
MCV: 87.8 fL (ref 80.0–100.0)
MPV: 10.2 fL (ref 7.5–12.5)
Platelets: 189 10*3/uL (ref 140–400)
RBC: 4.58 10*6/uL (ref 4.20–5.80)
RDW: 13 % (ref 11.0–15.0)
WBC: 7.2 10*3/uL (ref 3.8–10.8)

## 2018-02-07 LAB — COMPLETE METABOLIC PANEL WITH GFR
AG Ratio: 1.5 (calc) (ref 1.0–2.5)
ALBUMIN MSPROF: 4.1 g/dL (ref 3.6–5.1)
ALT: 24 U/L (ref 9–46)
AST: 22 U/L (ref 10–35)
Alkaline phosphatase (APISO): 53 U/L (ref 40–115)
BILIRUBIN TOTAL: 0.6 mg/dL (ref 0.2–1.2)
BUN / CREAT RATIO: 24 (calc) — AB (ref 6–22)
BUN: 32 mg/dL — ABNORMAL HIGH (ref 7–25)
CHLORIDE: 102 mmol/L (ref 98–110)
CO2: 30 mmol/L (ref 20–32)
Calcium: 9.4 mg/dL (ref 8.6–10.3)
Creat: 1.32 mg/dL — ABNORMAL HIGH (ref 0.70–1.25)
GFR, Est African American: 64 mL/min/{1.73_m2} (ref >=60)
GFR, Est Non African American: 55 mL/min/{1.73_m2} — ABNORMAL LOW (ref >=60)
GLOBULIN: 2.7 g/dL (ref 1.9–3.7)
GLUCOSE: 125 mg/dL — AB (ref 65–99)
Potassium: 4.4 mmol/L (ref 3.5–5.3)
SODIUM: 139 mmol/L (ref 135–146)
Total Protein: 6.8 g/dL (ref 6.1–8.1)

## 2018-02-07 LAB — LIPID PANEL
CHOLESTEROL: 119 mg/dL (ref <200)
HDL: 35 mg/dL — AB (ref >40)
LDL Cholesterol (Calc): 66 mg/dL (calc)
Non-HDL Cholesterol (Calc): 84 mg/dL (calc) (ref <130)
Total CHOL/HDL Ratio: 3.4 (calc) (ref <5.0)
Triglycerides: 97 mg/dL (ref <150)

## 2018-02-08 LAB — HEMOGLOBIN A1C
EAG (MMOL/L): 7 (calc)
HEMOGLOBIN A1C: 6 %{Hb} — AB (ref <5.7)
Mean Plasma Glucose: 126 (calc)

## 2018-02-08 LAB — VITAMIN B12: Vitamin B-12: 953 pg/mL (ref 200–1100)

## 2018-02-11 ENCOUNTER — Ambulatory Visit: Payer: Medicare HMO | Admitting: Osteopathic Medicine

## 2018-02-28 ENCOUNTER — Encounter: Payer: Self-pay | Admitting: Osteopathic Medicine

## 2018-02-28 ENCOUNTER — Telehealth: Payer: Self-pay | Admitting: Osteopathic Medicine

## 2018-02-28 ENCOUNTER — Ambulatory Visit (INDEPENDENT_AMBULATORY_CARE_PROVIDER_SITE_OTHER): Payer: Medicare HMO | Admitting: Osteopathic Medicine

## 2018-02-28 ENCOUNTER — Other Ambulatory Visit: Payer: Self-pay | Admitting: Osteopathic Medicine

## 2018-02-28 VITALS — BP 145/85 | HR 70 | Wt 284.8 lb

## 2018-02-28 DIAGNOSIS — N183 Chronic kidney disease, stage 3 unspecified: Secondary | ICD-10-CM

## 2018-02-28 DIAGNOSIS — Z87891 Personal history of nicotine dependence: Secondary | ICD-10-CM | POA: Diagnosis not present

## 2018-02-28 DIAGNOSIS — I10 Essential (primary) hypertension: Secondary | ICD-10-CM | POA: Diagnosis not present

## 2018-02-28 DIAGNOSIS — Z794 Long term (current) use of insulin: Secondary | ICD-10-CM | POA: Diagnosis not present

## 2018-02-28 DIAGNOSIS — D510 Vitamin B12 deficiency anemia due to intrinsic factor deficiency: Secondary | ICD-10-CM

## 2018-02-28 DIAGNOSIS — L568 Other specified acute skin changes due to ultraviolet radiation: Secondary | ICD-10-CM | POA: Diagnosis not present

## 2018-02-28 DIAGNOSIS — E119 Type 2 diabetes mellitus without complications: Secondary | ICD-10-CM

## 2018-02-28 DIAGNOSIS — H04203 Unspecified epiphora, bilateral lacrimal glands: Secondary | ICD-10-CM | POA: Diagnosis not present

## 2018-02-28 DIAGNOSIS — Z0000 Encounter for general adult medical examination without abnormal findings: Secondary | ICD-10-CM

## 2018-02-28 MED ORDER — SILDENAFIL CITRATE 20 MG PO TABS: 20.0000 mg | ORAL_TABLET | ORAL | 11 refills | Status: DC | PRN

## 2018-02-28 MED ORDER — LOSARTAN POTASSIUM-HCTZ 50-12.5 MG PO TABS: 1.0000 | ORAL_TABLET | Freq: Every day | ORAL | 3 refills | Status: DC

## 2018-02-28 MED ORDER — GLYBURIDE-METFORMIN 2.5-500 MG PO TABS: 2.0000 | ORAL_TABLET | Freq: Two times a day (BID) | ORAL | 3 refills | Status: DC

## 2018-02-28 MED ORDER — ATORVASTATIN CALCIUM 40 MG PO TABS: 40.0000 mg | ORAL_TABLET | Freq: Every day | ORAL | 3 refills | Status: DC

## 2018-02-28 MED ORDER — B-12 1000 MCG PO CAPS: 1000.0000 ug | ORAL_CAPSULE | Freq: Every day | ORAL | 3 refills | Status: DC

## 2018-02-28 NOTE — Progress Notes (Signed)
HPI: George Spence is a 67 y.o. male  who presents to [facility] today, 02/28/18,  for chief complaint of:  Check-up/labs Review chronic conditions    See below for review of preventive care   Pernicious Anemia: Previously on B12 injections, we transitioned to oral meds and levels have been ok, last checked 07/2017  HTN: We verified home monitor 11/2016 to within 10 mmHg but he has not been consistent about checking home blood pressure. BP high on intake, better on repeat.    DIABETES SCREENING/PREVENTIVE CARE: Updated 02/28/18  A1C past 3-6 mos: Yes  controlled? Yes   10/2016: 5.4% --> went to half dose metformin   02/12/17: 6.2% --> OK to continue current regimen  04/18/17: 6.7% -->  On Glyburide-Metformin 2.5-500 daily, will go back to higher dose   07/19/17: 6.1% --> Yay!   10/29/17: 5.9% --> Yay!  02/07/18: 6.0% --> Yay!  BP goal <130/80: not today! BP has been a challenge, patient is resistant to adjusting medications LDL goal <100: Yes 04/2016 and better now at 60s Eye exam annually: none on file, importance discussed with patient, requests ophtho referral anyway for watery eyes and photosensitivity  Foot exam: 04/18/17  Microalbuminuria:n/a on ACE Metformin: Yes  ACE/ARB: Yes  Antiplatelet if ASCVD Risk >10%: No  Statin: Yes  Pneumovax: Patient states he had pneumonia-23 shot at some point, cannot recall when but he thinks it was in this office, we don't have records. Flu vaccine: done 10/2016    Immunization History  Administered Date(s) Administered  . Influenza,inj,Quad PF,6+ Mos 10/30/2016, 07/19/2017  . Influenza-Unspecified 07/02/2014, 07/03/2015  . Pneumococcal Conjugate-13 10/30/2016  . Tdap 04/18/2017  . Zoster 10/15/2015      Past medical history, surgical history, social history and family history reviewed.  Patient Active Problem List   Diagnosis Date Noted  . Pernicious anemia 02/20/2017  . Chronic  kidney disease, stage III (moderate) (HCC) 02/12/2017  . Macrocytosis without anemia 11/13/2016  . Diabetes type 2, controlled (HCC) 08/13/2014  . Essential hypertension 08/12/2014  . Hyperlipidemia 08/12/2014    Current medication list and allergy/intolerance information reviewed.   Current Outpatient Medications on File Prior to Visit  Medication Sig Dispense Refill  . atorvastatin (LIPITOR) 40 MG tablet Take 1 tablet (40 mg total) by mouth daily. 90 tablet 3  . Cyanocobalamin (B-12) 1000 MCG CAPS Take 1,000 mcg by mouth daily. 90 capsule 3  . econazole nitrate 1 % cream Apply topically daily. To nail fungal infection 85 g 2  . glyBURIDE-metformin (GLUCOVANCE) 2.5-500 MG tablet Take 2 tablets by mouth 2 (two) times daily with a meal. 360 tablet 3  . losartan-hydrochlorothiazide (HYZAAR) 50-12.5 MG tablet Take 1 tablet by mouth daily. 90 tablet 0  . sildenafil (REVATIO) 20 MG tablet Take 1-4 tablets (20-80 mg total) by mouth as needed (prior to sex. Use lowst effective dose). 50 tablet 11   No current facility-administered medications on file prior to visit.    No Known Allergies    Review of Systems:  Constitutional: No recent illness  HEENT: No  headache, no vision change  Cardiac: No  chest pain, No  pressure, No palpitations  Respiratory:  No  shortness of breath. No  Cough  Neurologic: No  weakness, No  Dizziness   Exam:  BP (!) 145/85   Pulse 70   Wt 284 lb 12.8 oz (129.2 kg)   BMI 38.63 kg/m    Constitutional: VS see above. General Appearance:  alert, well-developed, well-nourished, NAD  Eyes: Normal lids and conjunctive, non-icteric sclera  Ears, Nose, Mouth, Throat: MMM, Normal external inspection ears/nares/mouth/lips/gums.  Neck: No masses, trachea midline.   Respiratory: Normal respiratory effort. no wheeze, no rhonchi, no rales  Cardiovascular: S1/S2 normal, no murmur, no rub/gallop auscultated. RRR.   Musculoskeletal: Gait normal. Symmetric and  independent movement of all extremities  Neurological: Normal balance/coordination. No tremor.  Skin: warm, dry, intact.   Psychiatric: Normal judgment/insight. Normal mood and affect. Oriented x3.   Recent Results (from the past 2160 hour(s))  B12     Status: None   Collection Time: 02/07/18 11:04 AM  Result Value Ref Range   Vitamin B-12 953 200 - 1,100 pg/mL  COMPLETE METABOLIC PANEL WITH GFR     Status: Abnormal   Collection Time: 02/07/18 11:07 AM  Result Value Ref Range   Glucose, Bld 125 (H) 65 - 99 mg/dL    Comment: .            Fasting reference interval . For someone without known diabetes, a glucose value between 100 and 125 mg/dL is consistent with prediabetes and should be confirmed with a follow-up test. .    BUN 32 (H) 7 - 25 mg/dL   Creat 1.61 (H) 0.96 - 1.25 mg/dL    Comment: For patients >4 years of age, the reference limit for Creatinine is approximately 13% higher for people identified as African-American. .    GFR, Est Non African American 55 (L) > OR = 60 mL/min/1.40m2   GFR, Est African American 64 > OR = 60 mL/min/1.62m2   BUN/Creatinine Ratio 24 (H) 6 - 22 (calc)   Sodium 139 135 - 146 mmol/L   Potassium 4.4 3.5 - 5.3 mmol/L   Chloride 102 98 - 110 mmol/L   CO2 30 20 - 32 mmol/L   Calcium 9.4 8.6 - 10.3 mg/dL   Total Protein 6.8 6.1 - 8.1 g/dL   Albumin 4.1 3.6 - 5.1 g/dL   Globulin 2.7 1.9 - 3.7 g/dL (calc)   AG Ratio 1.5 1.0 - 2.5 (calc)   Total Bilirubin 0.6 0.2 - 1.2 mg/dL   Alkaline phosphatase (APISO) 53 40 - 115 U/L   AST 22 10 - 35 U/L   ALT 24 9 - 46 U/L  Lipid panel     Status: Abnormal   Collection Time: 02/07/18 11:07 AM  Result Value Ref Range   Cholesterol 119 <200 mg/dL   HDL 35 (L) >04 mg/dL   Triglycerides 97 <540 mg/dL   LDL Cholesterol (Calc) 66 mg/dL (calc)    Comment: Reference range: <100 . Desirable range <100 mg/dL for primary prevention;   <70 mg/dL for patients with CHD or diabetic patients  with > or = 2  CHD risk factors. Marland Kitchen LDL-C is now calculated using the Martin-Hopkins  calculation, which is a validated novel method providing  better accuracy than the Friedewald equation in the  estimation of LDL-C.  Horald Pollen et al. Lenox Ahr. 9811;914(78): 2061-2068  (http://education.[employer].com/faq/FAQ164)    Total CHOL/HDL Ratio 3.4 <5.0 (calc)   Non-HDL Cholesterol (Calc) 84 <295 mg/dL (calc)    Comment: For patients with diabetes plus 1 major ASCVD risk  factor, treating to a non-HDL-C goal of <100 mg/dL  (LDL-C of <62 mg/dL) is considered a therapeutic  option.   CBC     Status: None   Collection Time: 02/07/18 11:07 AM  Result Value Ref Range   WBC 7.2 3.8 - 10.8 Thousand/uL  RBC 4.58 4.20 - 5.80 Million/uL   Hemoglobin 14.1 13.2 - 17.1 g/dL   HCT 16.1 09.6 - 04.5 %   MCV 87.8 80.0 - 100.0 fL   MCH 30.8 27.0 - 33.0 pg   MCHC 35.1 32.0 - 36.0 g/dL   RDW 40.9 81.1 - 91.4 %   Platelets 189 140 - 400 Thousand/uL   MPV 10.2 7.5 - 12.5 fL  Hemoglobin A1c     Status: Abnormal   Collection Time: 02/07/18 11:12 AM  Result Value Ref Range   Hgb A1c MFr Bld 6.0 (H) <5.7 % of total Hgb    Comment: For someone without known diabetes, a hemoglobin  A1c value between 5.7% and 6.4% is consistent with prediabetes and should be confirmed with a  follow-up test. . For someone with known diabetes, a value <7% indicates that their diabetes is well controlled. A1c targets should be individualized based on duration of diabetes, age, comorbid conditions, and other considerations. . This assay result is consistent with an increased risk of diabetes. . Currently, no consensus exists regarding use of hemoglobin A1c for diagnosis of diabetes for children. .    Mean Plasma Glucose 126 (calc)   eAG (mmol/L) 7.0 (calc)      ASSESSMENT/PLAN:   Annual physical exam  Pernicious anemia - Plan: Cyanocobalamin (B-12) 1000 MCG CAPS  Controlled type 2 diabetes mellitus without complication,  with long-term current use of insulin (HCC) - Plan: atorvastatin (LIPITOR) 40 MG tablet, glyBURIDE-metformin (GLUCOVANCE) 2.5-500 MG tablet  Essential hypertension - Plan: losartan-hydrochlorothiazide (HYZAAR) 50-12.5 MG tablet  Photosensitivity  Watery eyes - Plan: Ambulatory referral to Ophthalmology  Former smoker - Plan: US ABDOMINAL AORTA SCREENING AAA  CKD (chronic kidney disease) stage 3, GFR 30-59 ml/min (HCC)   Patient Instructions  Preventive care to catch up on:  Ultrasound for Abdominal Aorta in former smoker  Shingles shot - will call you when available  Pneumonia shot - will try to locate records, or we can repeat this   Colonoscopy - will try to locate records, or we can just refer to GI for cosnideration of repeat if it's been 10+ years      MALE PREVENTIVE CARE  updated 02/28/18  ANNUAL SCREENING/COUNSELING  Any changes to health in the past year? no  Diet/Exercise - HEALTHY HABITS DISCUSSED TO DECREASE CV RISK Social History   Tobacco Use  Smoking Status Former Smoker  . Last attempt to quit: 08/13/1979  . Years since quitting: 38.5  Smokeless Tobacco Never Used   Social History   Substance and Sexual Activity  Alcohol Use Yes  . Alcohol/week: 1.2 oz  . Types: 2 Standard drinks or equivalent per week   Depression screen [facility] 2/9 02/28/2018  Decreased Interest 0  Down, Depressed, Hopeless 0  PHQ - 2 Score 0  Altered sleeping 0  Tired, decreased energy 0  Change in appetite 0  Feeling bad or failure about yourself  0  Trouble concentrating 0  Moving slowly or fidgety/restless 0  Suicidal thoughts 0  PHQ-9 Score 0  Difficult doing work/chores Not difficult at all    SEXUAL/REPRODUCTIVE HEALTH  STI testing needed/desired today? - no  Any concerns with testosterone/libido? - no  INFECTIOUS DISEASE SCREENING  HIV - does not need  GC/CT - does not need  HepC - needs  TB - does not need  CANCER SCREENING  Lung - USPSTF: 55-80yo  w/ 30 py hx unless quit w/in 73yr - does not need  Colon -  needs, Oscar La did this, not sure when   Prostate - does not need  OTHER DISEASE SCREENING  Lipid - does not need  DM2 - does not need  AAA - 65-75yo ever smoked: needs  Osteoporosis - men 68yo+ - does not need  ADULT VACCINATION  Influenza - annual vaccine recommended  Td - booster every 10 years   Zoster - Shingrix recommended 47+ years old  PCV13 - pt reports already has but we need records   PPSV23 - already has Immunization History  Administered Date(s) Administered  . Influenza,inj,Quad PF,6+ Mos 10/30/2016, 07/19/2017  . Influenza-Unspecified 07/02/2014, 07/03/2015  . Pneumococcal Conjugate-13 10/30/2016  . Tdap 04/18/2017  . Zoster 10/15/2015         Follow-up plan: Return in about 6 months (around 08/31/2018) for recheck labs (blood draw week prior to visit w/ Dr A), sooner if needed .   Visit summary with medication list and pertinent instructions was printed for patient to review, alert [place] if any changes needed. All questions at time of visit were answered - patient instructed to contact office with any additional concerns. ER/RTC precautions were reviewed with the patient and understanding verbalized.

## 2018-02-28 NOTE — Patient Instructions (Signed)
Preventive care to catch up on:  Ultrasound for Abdominal Aorta in former smoker  Shingles shot - will call you when available  Pneumonia shot - will try to locate records, or we can repeat this   Colonoscopy - will try to locate records, or we can just refer to GI for cosnideration of repeat if it's been 10+ years

## 2018-02-28 NOTE — Telephone Encounter (Signed)
Added

## 2018-02-28 NOTE — Telephone Encounter (Signed)
-----   Message from Sunnie Nielsen, DO sent at 02/28/2018 10:02 AM EDT ----- Regarding: shingrix  Shingrix list thanks!

## 2018-03-06 ENCOUNTER — Telehealth: Payer: Self-pay | Admitting: Osteopathic Medicine

## 2018-03-06 DIAGNOSIS — N529 Male erectile dysfunction, unspecified: Secondary | ICD-10-CM

## 2018-03-06 MED ORDER — SILDENAFIL CITRATE 20 MG PO TABS: 20.0000 mg | ORAL_TABLET | ORAL | 11 refills | Status: DC | PRN

## 2018-03-06 NOTE — Telephone Encounter (Signed)
Rceived denial, re-sent under appropriate dx,

## 2018-03-08 ENCOUNTER — Ambulatory Visit (HOSPITAL_BASED_OUTPATIENT_CLINIC_OR_DEPARTMENT_OTHER)
Admission: RE | Admit: 2018-03-08 | Discharge: 2018-03-08 | Disposition: A | Discharge status: Home / Self Care | Payer: Medicare HMO | Source: Ambulatory Visit | Attending: Osteopathic Medicine | Admitting: Osteopathic Medicine

## 2018-03-08 ENCOUNTER — Other Ambulatory Visit: Payer: Self-pay | Admitting: Family

## 2018-03-08 DIAGNOSIS — I10 Essential (primary) hypertension: Secondary | ICD-10-CM | POA: Diagnosis not present

## 2018-03-08 DIAGNOSIS — Z136 Encounter for screening for cardiovascular disorders: Secondary | ICD-10-CM | POA: Insufficient documentation

## 2018-03-08 DIAGNOSIS — Z87891 Personal history of nicotine dependence: Secondary | ICD-10-CM | POA: Insufficient documentation

## 2018-04-09 ENCOUNTER — Telehealth: Payer: Self-pay

## 2018-04-09 NOTE — Telephone Encounter (Signed)
Pt was on wait list for Shingrix Vaccine.    Pt insurance will not cover for this to be given in office.   RX pended to send to pharmacy for vaccine.   Please route back so I can let pt know.    Thanks! 

## 2018-04-10 ENCOUNTER — Telehealth: Payer: Self-pay

## 2018-04-10 MED ORDER — ZOSTER VAC RECOMB ADJUVANTED 50 MCG/0.5ML IM SUSR: 0.5000 mL | Freq: Once | INTRAMUSCULAR | 1 refills | Status: AC

## 2018-04-10 NOTE — Telephone Encounter (Signed)
Pt advised that RX was sent to pharmacy and that he will need to get immunization from there.

## 2018-04-10 NOTE — Telephone Encounter (Signed)
Pt wanted me to give Dr Lyn HollingsheadAlexander an George ChildesFYI that he has received a copy of his most recent colonoscopy report (about 10 years ago) and he will bring the records with him to his appt in December for her to review.

## 2018-04-10 NOTE — Telephone Encounter (Signed)
[facility] on [facility]  

## 2018-04-10 NOTE — Telephone Encounter (Signed)
Noted. Thanks.

## 2018-06-21 ENCOUNTER — Telehealth: Payer: Self-pay

## 2018-06-21 DIAGNOSIS — E1121 Type 2 diabetes mellitus with diabetic nephropathy: Secondary | ICD-10-CM

## 2018-06-21 NOTE — Telephone Encounter (Signed)
[facility] thank your for the heads up - CF

## 2018-06-21 NOTE — Telephone Encounter (Signed)
Left pt a detailed vm msg regarding referral. Call back information provided.

## 2018-06-21 NOTE — Telephone Encounter (Signed)
Pt called - requesting a location in [place] for ophthalmologist. As per pt, will be for DM eye exam. Thanks.

## 2018-06-21 NOTE — Telephone Encounter (Signed)
Pt called requesting a referral to see an opthalmologist for an eye exam. Pt did not specify a particular location.

## 2018-06-21 NOTE — Telephone Encounter (Signed)
Referral placed.

## 2018-06-24 NOTE — Telephone Encounter (Signed)
Noted  

## 2018-06-28 DIAGNOSIS — R69 Illness, unspecified: Secondary | ICD-10-CM | POA: Diagnosis not present

## 2018-07-01 DIAGNOSIS — H527 Unspecified disorder of refraction: Secondary | ICD-10-CM | POA: Diagnosis not present

## 2018-07-01 DIAGNOSIS — H25813 Combined forms of age-related cataract, bilateral: Secondary | ICD-10-CM | POA: Diagnosis not present

## 2018-07-01 DIAGNOSIS — H02831 Dermatochalasis of right upper eyelid: Secondary | ICD-10-CM | POA: Diagnosis not present

## 2018-07-01 DIAGNOSIS — H02834 Dermatochalasis of left upper eyelid: Secondary | ICD-10-CM | POA: Diagnosis not present

## 2018-07-01 DIAGNOSIS — E113293 Type 2 diabetes mellitus with mild nonproliferative diabetic retinopathy without macular edema, bilateral: Secondary | ICD-10-CM | POA: Diagnosis not present

## 2018-07-01 DIAGNOSIS — H35372 Puckering of macula, left eye: Secondary | ICD-10-CM | POA: Diagnosis not present

## 2018-09-03 ENCOUNTER — Telehealth: Payer: Self-pay | Admitting: Osteopathic Medicine

## 2018-09-03 DIAGNOSIS — N183 Chronic kidney disease, stage 3 unspecified: Secondary | ICD-10-CM

## 2018-09-03 DIAGNOSIS — M2550 Pain in unspecified joint: Secondary | ICD-10-CM

## 2018-09-03 DIAGNOSIS — E1121 Type 2 diabetes mellitus with diabetic nephropathy: Secondary | ICD-10-CM

## 2018-09-03 DIAGNOSIS — D510 Vitamin B12 deficiency anemia due to intrinsic factor deficiency: Secondary | ICD-10-CM

## 2018-09-03 DIAGNOSIS — I10 Essential (primary) hypertension: Secondary | ICD-10-CM

## 2018-09-03 DIAGNOSIS — E785 Hyperlipidemia, unspecified: Secondary | ICD-10-CM

## 2018-09-03 DIAGNOSIS — Z0000 Encounter for general adult medical examination without abnormal findings: Secondary | ICD-10-CM

## 2018-09-03 NOTE — Telephone Encounter (Signed)
Pt left a voice mail stating he wanted his labs drawn before his appt. on Friday. Pt also requested any lab work that can be done for arthritis he would like. Please call patient once these orders are placed.

## 2018-09-03 NOTE — Telephone Encounter (Signed)
Pt advised.

## 2018-09-03 NOTE — Telephone Encounter (Signed)
Orders are in   I included basic inflammatory markers for arthritis, but FYI to patient these may not be included as "routine labs"

## 2018-09-04 DIAGNOSIS — D510 Vitamin B12 deficiency anemia due to intrinsic factor deficiency: Secondary | ICD-10-CM | POA: Diagnosis not present

## 2018-09-04 DIAGNOSIS — N183 Chronic kidney disease, stage 3 (moderate): Secondary | ICD-10-CM | POA: Diagnosis not present

## 2018-09-04 DIAGNOSIS — I10 Essential (primary) hypertension: Secondary | ICD-10-CM | POA: Diagnosis not present

## 2018-09-04 DIAGNOSIS — E785 Hyperlipidemia, unspecified: Secondary | ICD-10-CM | POA: Diagnosis not present

## 2018-09-04 DIAGNOSIS — Z0000 Encounter for general adult medical examination without abnormal findings: Secondary | ICD-10-CM | POA: Diagnosis not present

## 2018-09-04 DIAGNOSIS — M2550 Pain in unspecified joint: Secondary | ICD-10-CM | POA: Diagnosis not present

## 2018-09-04 DIAGNOSIS — E1121 Type 2 diabetes mellitus with diabetic nephropathy: Secondary | ICD-10-CM | POA: Diagnosis not present

## 2018-09-05 LAB — CBC
HEMATOCRIT: 44.4 % (ref 38.5–50.0)
HEMOGLOBIN: 15.2 g/dL (ref 13.2–17.1)
MCH: 30 pg (ref 27.0–33.0)
MCHC: 34.2 g/dL (ref 32.0–36.0)
MCV: 87.6 fL (ref 80.0–100.0)
MPV: 10.2 fL (ref 7.5–12.5)
Platelets: 228 10*3/uL (ref 140–400)
RBC: 5.07 10*6/uL (ref 4.20–5.80)
RDW: 13.4 % (ref 11.0–15.0)
WBC: 7 10*3/uL (ref 3.8–10.8)

## 2018-09-05 LAB — COMPLETE METABOLIC PANEL WITH GFR
AG Ratio: 1.6 (calc) (ref 1.0–2.5)
ALT: 22 U/L (ref 9–46)
AST: 22 U/L (ref 10–35)
Albumin: 4.5 g/dL (ref 3.6–5.1)
Alkaline phosphatase (APISO): 60 U/L (ref 40–115)
BUN/Creatinine Ratio: 24 (calc) — ABNORMAL HIGH (ref 6–22)
BUN: 35 mg/dL — ABNORMAL HIGH (ref 7–25)
CALCIUM: 10 mg/dL (ref 8.6–10.3)
CO2: 31 mmol/L (ref 20–32)
CREATININE: 1.47 mg/dL — AB (ref 0.70–1.25)
Chloride: 102 mmol/L (ref 98–110)
GFR, EST NON AFRICAN AMERICAN: 48 mL/min/{1.73_m2} — AB (ref >=60)
GFR, Est African American: 56 mL/min/{1.73_m2} — ABNORMAL LOW (ref >=60)
Globulin: 2.8 g/dL (calc) (ref 1.9–3.7)
Glucose, Bld: 96 mg/dL (ref 65–99)
Potassium: 4.3 mmol/L (ref 3.5–5.3)
Sodium: 142 mmol/L (ref 135–146)
TOTAL PROTEIN: 7.3 g/dL (ref 6.1–8.1)
Total Bilirubin: 0.8 mg/dL (ref 0.2–1.2)

## 2018-09-05 LAB — HEMOGLOBIN A1C
EAG (MMOL/L): 7.3 (calc)
HEMOGLOBIN A1C: 6.2 %{Hb} — AB (ref <5.7)
Mean Plasma Glucose: 131 (calc)

## 2018-09-05 LAB — TSH: TSH: 3.15 mIU/L (ref 0.40–4.50)

## 2018-09-05 LAB — LIPID PANEL
CHOL/HDL RATIO: 4 (calc) (ref <5.0)
Cholesterol: 132 mg/dL (ref <200)
HDL: 33 mg/dL — AB (ref >40)
LDL CHOLESTEROL (CALC): 78 mg/dL
NON-HDL CHOLESTEROL (CALC): 99 mg/dL (ref <130)
TRIGLYCERIDES: 120 mg/dL (ref <150)

## 2018-09-05 LAB — HIGH SENSITIVITY CRP: hs-CRP: 4.4 mg/L — ABNORMAL HIGH

## 2018-09-05 LAB — SEDIMENTATION RATE: Sed Rate: 22 mm/h — ABNORMAL HIGH (ref 0–20)

## 2018-09-05 LAB — VITAMIN B12: Vitamin B-12: 717 pg/mL (ref 200–1100)

## 2018-09-05 LAB — CK: Total CK: 78 U/L (ref 44–196)

## 2018-09-06 ENCOUNTER — Encounter: Payer: Self-pay | Admitting: Osteopathic Medicine

## 2018-09-06 ENCOUNTER — Ambulatory Visit (INDEPENDENT_AMBULATORY_CARE_PROVIDER_SITE_OTHER): Payer: Medicare HMO | Admitting: Osteopathic Medicine

## 2018-09-06 VITALS — BP 170/85 | HR 75 | Temp 97.6°F | Wt 281.0 lb

## 2018-09-06 DIAGNOSIS — E785 Hyperlipidemia, unspecified: Secondary | ICD-10-CM

## 2018-09-06 DIAGNOSIS — I10 Essential (primary) hypertension: Secondary | ICD-10-CM | POA: Diagnosis not present

## 2018-09-06 DIAGNOSIS — E1121 Type 2 diabetes mellitus with diabetic nephropathy: Secondary | ICD-10-CM | POA: Diagnosis not present

## 2018-09-06 NOTE — Progress Notes (Signed)
HPI: George Spence is a 67 y.o. male who  has a past medical history of Diabetes ([place]), Hyperlipidemia, Hypertension, and Type 2 diabetes mellitus without complication ([place]) (32/35/5732).  he presents to [facility] today, 09/06/18,  for chief complaint of:  Lab monitoring, BP monitoring  HTN: known above goal, have discussed risk/benefit, pt is reluctant to change or increase meds "I just feel weird on them" and hasn't taken meds today.   Pernicious anemia: recent B12 levels normal range on high dose oral supplementation   Diabetes: recent A1C stable in prediabetic range   Requested dome labs for arthritis: I added basic inflammatory markers, reveled slightly elevated CRP and ESR w/ normal CK. Bump on his hand, tendons feel hard, it doesn't really cause him pain or bother.          At today's visit... Past medical history, surgical history, and family history reviewed and updated as needed.  Current medication list and allergy/intolerance information reviewed and updated as needed. (See remainder of HPI, ROS, Phys Exam below)     Results for orders placed or performed in visit on 09/03/18 (from the past 72 hour(s))  CBC     Status: None   Collection Time: 09/04/18 11:09 AM  Result Value Ref Range   WBC 7.0 3.8 - 10.8 Thousand/uL   RBC 5.07 4.20 - 5.80 Million/uL   Hemoglobin 15.2 13.2 - 17.1 g/dL   HCT 44.4 38.5 - 50.0 %   MCV 87.6 80.0 - 100.0 fL   MCH 30.0 27.0 - 33.0 pg   MCHC 34.2 32.0 - 36.0 g/dL   RDW 13.4 11.0 - 15.0 %   Platelets 228 140 - 400 Thousand/uL   MPV 10.2 7.5 - 12.5 fL  COMPLETE METABOLIC PANEL WITH GFR     Status: Abnormal   Collection Time: 09/04/18 11:09 AM  Result Value Ref Range   Glucose, Bld 96 65 - 99 mg/dL    Comment: .    BUN 35 (H) 7 - 25 mg/dL   Creat 1.47 (H) 0.70 - 1.25 mg/dL        GFR, Est Non African American 48 (L) > OR = 60 mL/min/1.71m   GFR, Est African American 56 (L) > OR = 60  mL/min/1.73m  BUN/Creatinine Ratio 24 (H) 6 - 22 (calc)   Sodium 142 135 - 146 mmol/L   Potassium 4.3 3.5 - 5.3 mmol/L   Chloride 102 98 - 110 mmol/L   CO2 31 20 - 32 mmol/L   Calcium 10.0 8.6 - 10.3 mg/dL   Total Protein 7.3 6.1 - 8.1 g/dL   Albumin 4.5 3.6 - 5.1 g/dL   Globulin 2.8 1.9 - 3.7 g/dL (calc)   AG Ratio 1.6 1.0 - 2.5 (calc)   Total Bilirubin 0.8 0.2 - 1.2 mg/dL   Alkaline phosphatase (APISO) 60 40 - 115 U/L   AST 22 10 - 35 U/L   ALT 22 9 - 46 U/L  Lipid panel     Status: Abnormal   Collection Time: 09/04/18 11:09 AM  Result Value Ref Range   Cholesterol 132 <200 mg/dL   HDL 33 (L) >40 mg/dL   Triglycerides 120 <150 mg/dL   LDL Cholesterol (Calc) 78 mg/dL (calc)        Total CHOL/HDL Ratio 4.0 <5.0 (calc)   Non-HDL Cholesterol (Calc) 99 <130 mg/dL (calc)       Hemoglobin A1c     Status: Abnormal   Collection Time:  09/04/18 11:09 AM  Result Value Ref Range   Hgb A1c MFr Bld 6.2 (H) <5.7 % of total Hgb        Mean Plasma Glucose 131 (calc)   eAG (mmol/L) 7.3 (calc)  TSH     Status: None   Collection Time: 09/04/18 11:09 AM  Result Value Ref Range   TSH 3.15 0.40 - 4.50 mIU/L  Vitamin B12     Status: None   Collection Time: 09/04/18 11:09 AM  Result Value Ref Range   Vitamin B-12 717 200 - 1,100 pg/mL  High sensitivity CRP     Status: Abnormal   Collection Time: 09/04/18 11:09 AM  Result Value Ref Range   hs-CRP 4.4 (H) mg/L       Sedimentation rate     Status: Abnormal   Collection Time: 09/04/18 11:09 AM  Result Value Ref Range   Sed Rate 22 (H) 0 - 20 mm/h  CK     Status: None   Collection Time: 09/04/18 11:09 AM  Result Value Ref Range   Total CK 78 44 - 196 U/L          ASSESSMENT/PLAN: The primary encounter diagnosis was Essential hypertension. Diagnoses of Controlled type 2 diabetes mellitus with diabetic nephropathy, without long-term current use of insulin (HCC) and Hyperlipidemia, unspecified hyperlipidemia type were also pertinent  to this visit.   Patient Instructions  Thanks for the jokes!  Continue current medicines Will recheck labs in 6 months    Advised f/u sports med as needed if and starts to give him pain.    Follow-up plan: Return in about 6 months (around 03/08/2019) for [place]  - sooner if needed.                             ############################################ ############################################ ############################################ ############################################    Current Meds  Medication Sig  . atorvastatin (LIPITOR) 40 MG tablet Take 1 tablet (40 mg total) by mouth daily.  . Cyanocobalamin (B-12) 1000 MCG CAPS Take 1,000 mcg by mouth daily.  Marland Kitchen econazole nitrate 1 % cream Apply topically daily. To nail fungal infection  . glyBURIDE-metformin (GLUCOVANCE) 2.5-500 MG tablet Take 2 tablets by mouth 2 (two) times daily with a meal.  . losartan-hydrochlorothiazide (HYZAAR) 50-12.5 MG tablet Take 1 tablet by mouth daily.  . sildenafil (REVATIO) 20 MG tablet Take 1-4 tablets (20-80 mg total) by mouth as needed (prior to sex. Use lowst effective dose).    No Known Allergies     Review of Systems:  Constitutional: No recent illness  HEENT: No  headache, no vision change  Cardiac: No  chest pain, No  pressure, No palpitations  Respiratory:  No  shortness of breath. No  Cough  Gastrointestinal: No  abdominal pain, no change on bowel habits  Musculoskeletal: No new myalgia/arthralgia  Neurologic: No  weakness, No  Dizziness  Psychiatric: No  concerns with depression, No  concerns with anxiety  Exam:  BP (!) 170/85   Pulse 75   Temp 97.6 F (36.4 C) (Oral)   Wt 281 lb (127.5 kg)   BMI 38.11 kg/m   Constitutional: VS see above. General Appearance: alert, well-developed, well-nourished, NAD  Eyes: Normal lids and conjunctive, non-icteric sclera  Ears, Nose, Mouth, Throat: MMM, Normal  external inspection ears/nares/mouth/lips/gums.  Neck: No masses, trachea midline.   Respiratory: Normal respiratory effort. no wheeze, no rhonchi, no rales  Cardiovascular: S1/S2 normal,  no murmur, no rub/gallop auscultated. RRR.   Musculoskeletal: Gait normal. Symmetric and independent movement of all extremities. Nodular scar tissue type band on L hand, likely also tendon sheath cyst, normal hand and finger ROM and strength bilaterally  Abdominal: non-tender, non-distended, no appreciable organomegaly, neg Murphy's, BS [id number]  Neurological: Normal balance/coordination. No tremor.  Skin: warm, dry, intact.   Psychiatric: Normal judgment/insight. Normal mood and affect. Oriented x3.       Visit summary with medication list and pertinent instructions was printed for patient to review, patient was advised to alert [place] if any updates are needed. All questions at time of visit were answered - patient instructed to contact office with any additional concerns. ER/RTC precautions were reviewed with the patient and understanding verbalized.     Please note: voice recognition software was used to produce this document, and typos may escape review. Please contact Dr. Sheppard Coil for any needed clarifications.    Follow up plan: Return in about 6 months (around 03/08/2019) for [address]  - sooner if needed.

## 2018-09-06 NOTE — Patient Instructions (Signed)
Thanks for the jokes!  Continue current medicines Will recheck labs in 6 months

## 2018-09-19 ENCOUNTER — Encounter: Payer: Self-pay | Admitting: Osteopathic Medicine

## 2018-12-06 ENCOUNTER — Other Ambulatory Visit: Payer: Self-pay

## 2018-12-06 ENCOUNTER — Telehealth: Payer: Self-pay

## 2018-12-06 DIAGNOSIS — E119 Type 2 diabetes mellitus without complications: Secondary | ICD-10-CM

## 2018-12-06 DIAGNOSIS — Z794 Long term (current) use of insulin: Principal | ICD-10-CM

## 2018-12-06 MED ORDER — GLYBURIDE-METFORMIN 2.5-500 MG PO TABS: 2.0000 | ORAL_TABLET | Freq: Two times a day (BID) | ORAL | 3 refills | Status: DC

## 2018-12-06 MED ORDER — IRBESARTAN 150 MG PO TABS: 150.0000 mg | ORAL_TABLET | Freq: Every day | ORAL | 0 refills | Status: DC

## 2018-12-06 MED ORDER — HYDROCHLOROTHIAZIDE 12.5 MG PO TABS: 12.5000 mg | ORAL_TABLET | Freq: Every day | ORAL | 3 refills | Status: DC

## 2018-12-06 NOTE — Telephone Encounter (Signed)
Alternative sent: Separate prescriptions for hydrochlorothiazide and irbesartan.  Patient will need to come to the clinic for nurse visit 1 to 2 weeks after starting new medication to ensure that it is effective, let us know sooner if any problems.

## 2018-12-06 NOTE — Telephone Encounter (Signed)
[facility] - losartan of all strengths on long-term back order. Would provider like to switch medication? Pls advise.

## 2018-12-06 NOTE — Telephone Encounter (Signed)
Pt advised. NV scheduled.

## 2018-12-09 NOTE — Progress Notes (Signed)
Received a fax from [place] that Glucovance was approved through 10/01/2019. Pharmacy aware and forms sent to scan.

## 2018-12-23 ENCOUNTER — Ambulatory Visit: Payer: Medicare HMO

## 2018-12-30 ENCOUNTER — Ambulatory Visit: Payer: Medicare HMO

## 2019-02-04 ENCOUNTER — Telehealth: Payer: Self-pay

## 2019-02-04 DIAGNOSIS — E785 Hyperlipidemia, unspecified: Secondary | ICD-10-CM

## 2019-02-04 DIAGNOSIS — D510 Vitamin B12 deficiency anemia due to intrinsic factor deficiency: Secondary | ICD-10-CM

## 2019-02-04 DIAGNOSIS — R7309 Other abnormal glucose: Secondary | ICD-10-CM

## 2019-02-04 DIAGNOSIS — I10 Essential (primary) hypertension: Secondary | ICD-10-CM

## 2019-02-04 NOTE — Telephone Encounter (Signed)
Orders are in

## 2019-02-04 NOTE — Telephone Encounter (Signed)
Pt has an upcoming appt on 03/10/19. Requesting to have lab work done before visit. Requesting general labs including pre-diabetes check. As per pt, last blood sugar was out of range.

## 2019-02-05 NOTE — Telephone Encounter (Signed)
Pt advised.

## 2019-02-12 ENCOUNTER — Other Ambulatory Visit: Payer: Self-pay | Admitting: Osteopathic Medicine

## 2019-02-12 DIAGNOSIS — E119 Type 2 diabetes mellitus without complications: Secondary | ICD-10-CM

## 2019-02-12 DIAGNOSIS — Z794 Long term (current) use of insulin: Secondary | ICD-10-CM

## 2019-02-28 ENCOUNTER — Other Ambulatory Visit: Payer: Self-pay | Admitting: Osteopathic Medicine

## 2019-02-28 DIAGNOSIS — E119 Type 2 diabetes mellitus without complications: Secondary | ICD-10-CM

## 2019-02-28 DIAGNOSIS — Z794 Long term (current) use of insulin: Secondary | ICD-10-CM

## 2019-03-10 ENCOUNTER — Encounter: Payer: Medicare HMO | Admitting: Osteopathic Medicine

## 2019-03-14 ENCOUNTER — Other Ambulatory Visit: Payer: Self-pay | Admitting: Osteopathic Medicine

## 2019-03-14 DIAGNOSIS — E119 Type 2 diabetes mellitus without complications: Secondary | ICD-10-CM

## 2019-03-14 DIAGNOSIS — Z794 Long term (current) use of insulin: Secondary | ICD-10-CM

## 2019-03-14 NOTE — Telephone Encounter (Signed)
OK to refill (I took care of it) but he is due for annual / labs can we schedule him? Thanks.

## 2019-03-14 NOTE — Telephone Encounter (Signed)
Appointment has been made. No further questions at this time.  

## 2019-03-14 NOTE — Telephone Encounter (Signed)
Forwarding medication refill to PCP for review. 

## 2019-03-28 ENCOUNTER — Other Ambulatory Visit: Payer: Self-pay | Admitting: Osteopathic Medicine

## 2019-04-03 DIAGNOSIS — E785 Hyperlipidemia, unspecified: Secondary | ICD-10-CM | POA: Diagnosis not present

## 2019-04-03 DIAGNOSIS — R7309 Other abnormal glucose: Secondary | ICD-10-CM | POA: Diagnosis not present

## 2019-04-03 DIAGNOSIS — D510 Vitamin B12 deficiency anemia due to intrinsic factor deficiency: Secondary | ICD-10-CM | POA: Diagnosis not present

## 2019-04-03 DIAGNOSIS — I10 Essential (primary) hypertension: Secondary | ICD-10-CM | POA: Diagnosis not present

## 2019-04-04 LAB — CBC
HCT: 43.1 % (ref 38.5–50.0)
Hemoglobin: 14.9 g/dL (ref 13.2–17.1)
MCH: 31.4 pg (ref 27.0–33.0)
MCHC: 34.6 g/dL (ref 32.0–36.0)
MCV: 90.9 fL (ref 80.0–100.0)
MPV: 10.3 fL (ref 7.5–12.5)
Platelets: 216 10*3/uL (ref 140–400)
RBC: 4.74 10*6/uL (ref 4.20–5.80)
RDW: 13.8 % (ref 11.0–15.0)
WBC: 6.3 10*3/uL (ref 3.8–10.8)

## 2019-04-04 LAB — COMPLETE METABOLIC PANEL WITH GFR
AG Ratio: 1.5 (calc) (ref 1.0–2.5)
ALT: 21 U/L (ref 9–46)
AST: 20 U/L (ref 10–35)
Albumin: 4.1 g/dL (ref 3.6–5.1)
Alkaline phosphatase (APISO): 57 U/L (ref 35–144)
BUN/Creatinine Ratio: 18 (calc) (ref 6–22)
BUN: 24 mg/dL (ref 7–25)
CO2: 28 mmol/L (ref 20–32)
Calcium: 9.2 mg/dL (ref 8.6–10.3)
Chloride: 104 mmol/L (ref 98–110)
Creat: 1.33 mg/dL — ABNORMAL HIGH (ref 0.70–1.25)
GFR, Est African American: 63 mL/min/{1.73_m2} (ref >=60)
GFR, Est Non African American: 55 mL/min/{1.73_m2} — ABNORMAL LOW (ref >=60)
Globulin: 2.8 g/dL (calc) (ref 1.9–3.7)
Glucose, Bld: 133 mg/dL — ABNORMAL HIGH (ref 65–99)
Potassium: 4.5 mmol/L (ref 3.5–5.3)
Sodium: 143 mmol/L (ref 135–146)
Total Bilirubin: 0.5 mg/dL (ref 0.2–1.2)
Total Protein: 6.9 g/dL (ref 6.1–8.1)

## 2019-04-04 LAB — LIPID PANEL
Cholesterol: 121 mg/dL (ref <200)
HDL: 33 mg/dL — ABNORMAL LOW (ref >=40)
LDL Cholesterol (Calc): 67 mg/dL (calc)
Non-HDL Cholesterol (Calc): 88 mg/dL (calc) (ref <130)
Total CHOL/HDL Ratio: 3.7 (calc) (ref <5.0)
Triglycerides: 124 mg/dL (ref <150)

## 2019-04-04 LAB — HEMOGLOBIN A1C
Hgb A1c MFr Bld: 6.1 % of total Hgb — ABNORMAL HIGH (ref <5.7)
Mean Plasma Glucose: 128 (calc)
eAG (mmol/L): 7.1 (calc)

## 2019-04-04 LAB — VITAMIN B12: Vitamin B-12: 421 pg/mL (ref 200–1100)

## 2019-04-07 ENCOUNTER — Encounter: Payer: Self-pay | Admitting: Osteopathic Medicine

## 2019-04-07 ENCOUNTER — Ambulatory Visit (INDEPENDENT_AMBULATORY_CARE_PROVIDER_SITE_OTHER): Payer: Medicare HMO | Admitting: Osteopathic Medicine

## 2019-04-07 ENCOUNTER — Other Ambulatory Visit: Payer: Self-pay

## 2019-04-07 VITALS — BP 156/83 | HR 76 | Temp 97.6°F | Wt 287.0 lb

## 2019-04-07 DIAGNOSIS — Z0000 Encounter for general adult medical examination without abnormal findings: Secondary | ICD-10-CM

## 2019-04-07 DIAGNOSIS — Z23 Encounter for immunization: Secondary | ICD-10-CM

## 2019-04-07 DIAGNOSIS — D510 Vitamin B12 deficiency anemia due to intrinsic factor deficiency: Secondary | ICD-10-CM

## 2019-04-07 DIAGNOSIS — N183 Chronic kidney disease, stage 3 unspecified: Secondary | ICD-10-CM

## 2019-04-07 DIAGNOSIS — E1121 Type 2 diabetes mellitus with diabetic nephropathy: Secondary | ICD-10-CM

## 2019-04-07 DIAGNOSIS — E785 Hyperlipidemia, unspecified: Secondary | ICD-10-CM

## 2019-04-07 DIAGNOSIS — Z794 Long term (current) use of insulin: Secondary | ICD-10-CM

## 2019-04-07 DIAGNOSIS — E119 Type 2 diabetes mellitus without complications: Secondary | ICD-10-CM

## 2019-04-07 DIAGNOSIS — I10 Essential (primary) hypertension: Secondary | ICD-10-CM | POA: Diagnosis not present

## 2019-04-07 MED ORDER — ATORVASTATIN CALCIUM 40 MG PO TABS: 40.0000 mg | ORAL_TABLET | Freq: Every day | ORAL | 3 refills | Status: DC

## 2019-04-07 MED ORDER — IRBESARTAN 150 MG PO TABS: 150.0000 mg | ORAL_TABLET | Freq: Every day | ORAL | 3 refills | Status: DC

## 2019-04-07 MED ORDER — B-12 1000 MCG PO CAPS: 2000.0000 ug | ORAL_CAPSULE | Freq: Every day | ORAL | 3 refills | Status: DC

## 2019-04-07 NOTE — Patient Instructions (Addendum)
General Preventive Care  Most recent routine screening lipids/other labs: already done.  Tobacco: don't!   Alcohol: responsible moderation is ok for most adults - if you have concerns about your alcohol intake, please talk to me!   Exercise: as tolerated to reduce risk of cardiovascular disease and diabetes. Strength training will also prevent osteoporosis.   Mental health: if need for mental health care (medicines, counseling, other), or concerns about moods, please let me know!   Sexual health: if need for STD testing, or if concerns with libido/pain problems, please let me know!  Advanced Directive: Living Will and/or Healthcare Power of Attorney recommended for all adults, regardless of age or health.  Vaccines  Flu vaccine: recommended for almost everyone, every fall.   Shingles vaccine: Shingrix all done!   Pneumonia vaccines: Prevnar and Pneumovax recommended after age 70. Prevnar already done. Pneumovax updated today, you're done!   Tetanus booster: Tdap due 04/2027  Cancer screenings   Colon cancer screening: recommended for everyone at age 32-75. Last colonoscopy on file is from 2003, per that report it was recommended to follow-up in 2008  Prostate cancer screening: annual PSA blood test age 19-71  Lung cancer screening: not needed since you quit more than 15 years ago Infection screenings . HIV: recommended screening at least once age 24-65 . Gonorrhea/Chlamydia: screening as needed . Hepatitis C: recommended for anyone born 09-1965. Never done.  . TB: certain at-risk populations, or depending on work requirements and/or travel history Other . Bone Density Test: recommended for men at age 42 . Abdominal Aortic Aneurysm: screening with ultrasound already done 2019    Immunization History  Administered Date(s) Administered  . Influenza, High Dose Seasonal PF 06/28/2018  . Influenza,inj,Quad PF,6+ Mos 10/30/2016, 07/19/2017  . Influenza-Unspecified 07/02/2014,  07/03/2015  . Pneumococcal Conjugate-13 10/30/2016  . Pneumococcal Polysaccharide-23 04/07/2019  . Tdap 04/18/2017  . Zoster 10/15/2015  . Zoster Recombinat (Shingrix) 06/28/2018, 09/03/2018

## 2019-04-07 NOTE — Progress Notes (Signed)
HPI: George Spence is a 68 y.o. male who  has a past medical history of Diabetes (HCC), Hyperlipidemia, Hypertension, and Type 2 diabetes mellitus without complication (HCC) (08/12/2014).  he presents to [facility] [place] today, 04/07/19,  for chief complaint of: Annual Physical     Patient here for annual physical / wellness exam.  See preventive care reviewed as below.  Recent labs reviewed in detail with the patient.   Additional concerns today include:  None        Past medical, surgical, social and family history reviewed:  Patient Active Problem List   Diagnosis Date Noted  . Former smoker 02/28/2018  . Photosensitivity 02/28/2018  . Watery eyes 02/28/2018  . Pernicious anemia 02/20/2017  . CKD (chronic kidney disease) stage 3, GFR 30-59 ml/min (HCC) 02/12/2017  . Macrocytosis without anemia 11/13/2016  . Diabetes type 2, controlled (HCC) 08/13/2014  . Essential hypertension 08/12/2014  . Hyperlipidemia 08/12/2014    No past surgical history on file.  Social History   Tobacco Use  . Smoking status: Former Smoker    Quit date: 08/13/1979    Years since quitting: 39.6  . Smokeless tobacco: Never Used  Substance Use Topics  . Alcohol use: Yes    Alcohol/week: 2.0 standard drinks    Types: 2 Standard drinks or equivalent per week    No family history on file.   Current medication list and allergy/intolerance information reviewed:    Current Outpatient Medications  Medication Sig Dispense Refill  . atorvastatin (LIPITOR) 40 MG tablet Take 1 tablet (40 mg total) by mouth daily. 90 tablet 3  . Cyanocobalamin (B-12) 1000 MCG CAPS Take 2,000 mcg by mouth daily. 180 capsule 3  . econazole nitrate 1 % cream Apply topically daily. To nail fungal infection 85 g 2  . glyBURIDE-metformin (GLUCOVANCE) 2.5-500 MG tablet TAKE 2 TABLETS TWICE A DAY WITH A MEAL 120 tablet 3  . hydrochlorothiazide (HYDRODIURIL) 12.5 MG tablet Take 1 tablet  (12.5 mg total) by mouth daily. 90 tablet 3  . irbesartan (AVAPRO) 150 MG tablet Take 1 tablet (150 mg total) by mouth daily. 90 tablet 3   No current facility-administered medications for this visit.     No Known Allergies    Review of Systems:  Constitutional:  No  fever, no chills, No recent illness, No unintentional weight changes. No significant fatigue.   HEENT: No  headache, no vision change, no hearing change, No sore throat, No  sinus pressure  Cardiac: No  chest pain, No  pressure, No palpitations, No  Orthopnea  Respiratory:  No  shortness of breath. No  Cough  Gastrointestinal: No  abdominal pain, No  nausea, No  vomiting,  No  blood in stool, No  diarrhea, No  constipation   Musculoskeletal: No new myalgia/arthralgia  Skin: No  Rash, No other wounds/concerning lesions  Genitourinary: No  incontinence, No  abnormal genital bleeding, No abnormal genital discharge  Hem/Onc: No  easy bruising/bleeding, No  abnormal lymph node  Endocrine: No cold intolerance,  No heat intolerance. No polyuria/polydipsia/polyphagia   Neurologic: No  weakness, No  dizziness, No  slurred speech/focal weakness/facial droop  Psychiatric: No  concerns with depression, No  concerns with anxiety, No sleep problems, No mood problems  Exam:  BP (!) 156/83 (BP Location: Left Arm, Patient Position: Sitting, Cuff Size: Large)   Pulse 76   Temp 97.6 F (36.4 C) (Oral)   Wt 287 lb (130.2 kg)  BMI 38.92 kg/m   BP Readings from Last 3 Encounters:  04/07/19 (!) 156/83  09/06/18 (!) 170/85  02/28/18 (!) 145/85    Constitutional: VS see above. General Appearance: alert, well-developed, well-nourished, NAD  Eyes: Normal lids and conjunctive, non-icteric sclera  Ears, Nose, Mouth, Throat: MMM, Normal external inspection ears/nares/mouth/lips/gums. TM normal bilaterally. Pharynx/tonsils no erythema, no exudate. Nasal mucosa normal.   Neck: No masses, trachea midline. No thyroid enlargement.  No tenderness/mass appreciated. No lymphadenopathy  Respiratory: Normal respiratory effort. no wheeze, no rhonchi, no rales  Cardiovascular: S1/S2 normal, no murmur, no rub/gallop auscultated. RRR. No lower extremity edema. Pedal pulse II/IV bilaterally DP and PT. No carotid bruit or JVD. No abdominal aortic bruit.  Gastrointestinal: Nontender, no masses. No hepatomegaly, no splenomegaly. No hernia appreciated. Bowel sounds normal. Rectal exam deferred.   Musculoskeletal: Gait normal. No clubbing/cyanosis of digits.   Neurological: Normal balance/coordination. No tremor. No cranial nerve deficit on limited exam. Motor and sensation intact and symmetric. Cerebellar reflexes intact.   Skin: warm, dry, intact. No rash/ulcer. No concerning nevi or subq nodules on limited exam.    Psychiatric: Normal judgment/insight. Normal mood and affect. Oriented x3.      ASSESSMENT/PLAN: The primary encounter diagnosis was Annual physical exam. Diagnoses of Controlled type 2 diabetes mellitus with diabetic nephropathy, without long-term current use of insulin (HCC), CKD (chronic kidney disease) stage 3, GFR 30-59 ml/min (HCC), Hyperlipidemia, unspecified hyperlipidemia type, Essential hypertension, Pernicious anemia, Need for pneumococcal vaccination, and Controlled type 2 diabetes mellitus without complication, with long-term current use of insulin (HCC) were also pertinent to this visit.  Declined BP Rx adjustment  Forgot to have PSA on labs, pt ok to wait unti lnext blood draw in 6 mos   Orders Placed This Encounter  Procedures  . Pneumococcal polysaccharide vaccine 23-valent greater than or equal to 2yo subcutaneous/IM  . COMPLETE METABOLIC PANEL WITH GFR  . Hemoglobin A1c  . PSA, Total with Reflex to PSA, Free  . Vitamin B12    Meds ordered this encounter  Medications  . atorvastatin (LIPITOR) 40 MG tablet    Sig: Take 1 tablet (40 mg total) by mouth daily.    Dispense:  90 tablet     Refill:  3  . Cyanocobalamin (B-12) 1000 MCG CAPS    Sig: Take 2,000 mcg by mouth daily.    Dispense:  180 capsule    Refill:  3  . irbesartan (AVAPRO) 150 MG tablet    Sig: Take 1 tablet (150 mg total) by mouth daily.    Dispense:  90 tablet    Refill:  3    Patient Instructions   General Preventive Care  Most recent routine screening lipids/other labs: already done.  Tobacco: don't!   Alcohol: responsible moderation is ok for most adults - if you have concerns about your alcohol intake, please talk to me!   Exercise: as tolerated to reduce risk of cardiovascular disease and diabetes. Strength training will also prevent osteoporosis.   Mental health: if need for mental health care (medicines, counseling, other), or concerns about moods, please let me know!   Sexual health: if need for STD testing, or if concerns with libido/pain problems, please let me know!  Advanced Directive: Living Will and/or Healthcare Power of Attorney recommended for all adults, regardless of age or health.  Vaccines  Flu vaccine: recommended for almost everyone, every fall.   Shingles vaccine: Shingrix all done!   Pneumonia vaccines: Prevnar and Pneumovax recommended after age  52. Prevnar already done. Pneumovax updated today, you're done!   Tetanus booster: Tdap due 04/2027  Cancer screenings   Colon cancer screening: recommended for everyone at age 50-75. Last colonoscopy on file is from 2003, per that report it was recommended to follow-up in 2008  Prostate cancer screening: annual PSA blood test age 45-71  Lung cancer screening: not needed since you quit more than 15 years ago Infection screenings . HIV: recommended screening at least once age 55-65 . Gonorrhea/Chlamydia: screening as needed . Hepatitis C: recommended for anyone born 10-1964. Never done.  . TB: certain at-risk populations, or depending on work requirements and/or travel history Other . Bone Density Test: recommended  for men at age 50 . Abdominal Aortic Aneurysm: screening with ultrasound already done 2019    Immunization History  Administered Date(s) Administered  . Influenza, High Dose Seasonal PF 06/28/2018  . Influenza,inj,Quad PF,6+ Mos 10/30/2016, 07/19/2017  . Influenza-Unspecified 07/02/2014, 07/03/2015  . Pneumococcal Conjugate-13 10/30/2016  . Pneumococcal Polysaccharide-23 04/07/2019  . Tdap 04/18/2017  . Zoster 10/15/2015  . Zoster Recombinat (Shingrix) 06/28/2018, 09/03/2018         Visit summary with medication list and pertinent instructions was printed for patient to review. All questions at time of visit were answered - patient instructed to contact office with any additional concerns or updates. ER/RTC precautions were reviewed with the patient.      Please note: voice recognition software was used to produce this document, and typos may escape review. Please contact Dr. Sheppard Coil for any needed clarifications.     Follow-up plan: Return in about 6 months (around 10/08/2019) for every 6-mos labs including A1C and B12, monitor BP, see me sooner if needed (lab orders are in!).

## 2019-04-10 ENCOUNTER — Other Ambulatory Visit: Payer: Self-pay

## 2019-04-10 MED ORDER — HYDROCHLOROTHIAZIDE 12.5 MG PO TABS: 12.5000 mg | ORAL_TABLET | Freq: Every day | ORAL | 3 refills | Status: DC

## 2019-04-27 ENCOUNTER — Other Ambulatory Visit: Payer: Self-pay | Admitting: Osteopathic Medicine

## 2019-05-13 ENCOUNTER — Encounter: Payer: Medicare HMO | Admitting: Osteopathic Medicine

## 2019-07-16 DIAGNOSIS — R69 Illness, unspecified: Secondary | ICD-10-CM | POA: Diagnosis not present

## 2019-07-22 LAB — COLOGUARD: Cologuard: NEGATIVE

## 2019-09-19 IMAGING — US US ABDOMINAL AORTA SCREENING AAA
1 series · 13 of 13 positions shown · non-contrast
Comparison: None.

CLINICAL DATA: Hypertension, diabetes and hyperlipidemia.

EXAM:
ULTRASOUND OF ABDOMINAL AORTA
TECHNIQUE: Ultrasound examination of the abdominal aorta was performed to
evaluate for abdominal aortic aneurysm.

[Series 1: us abdominal aorta screening aaa · 0.28mm/px · 13 of 13 slices shown]
[im 1/13]
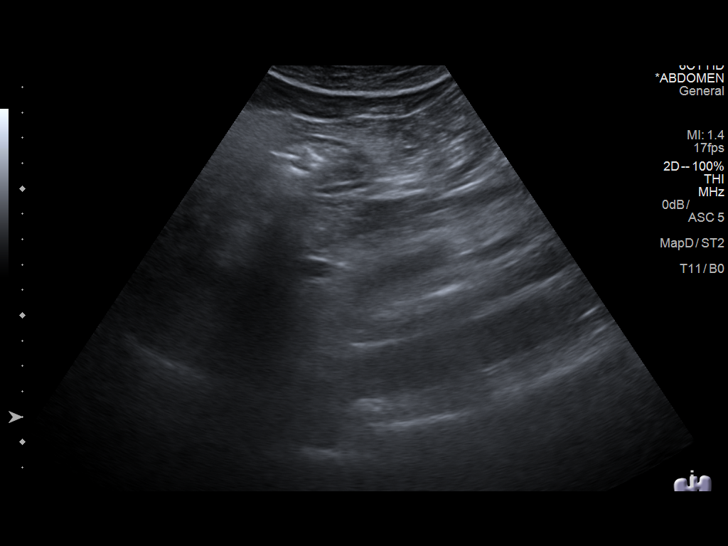
[im 2/13]
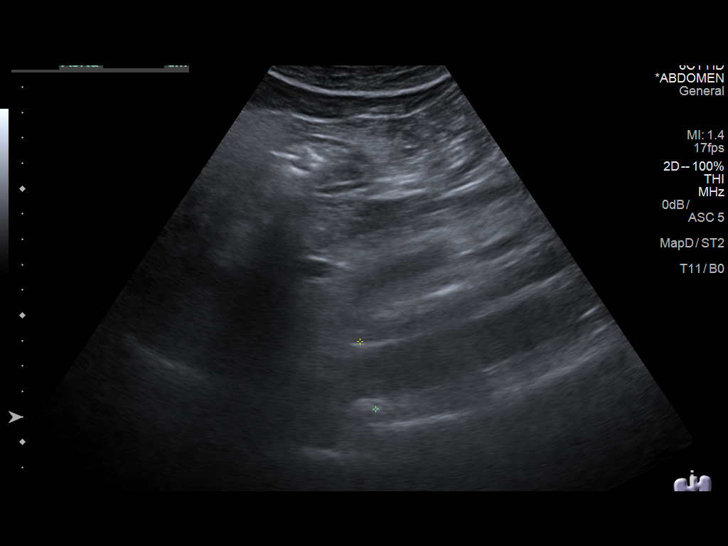
[im 3/13]
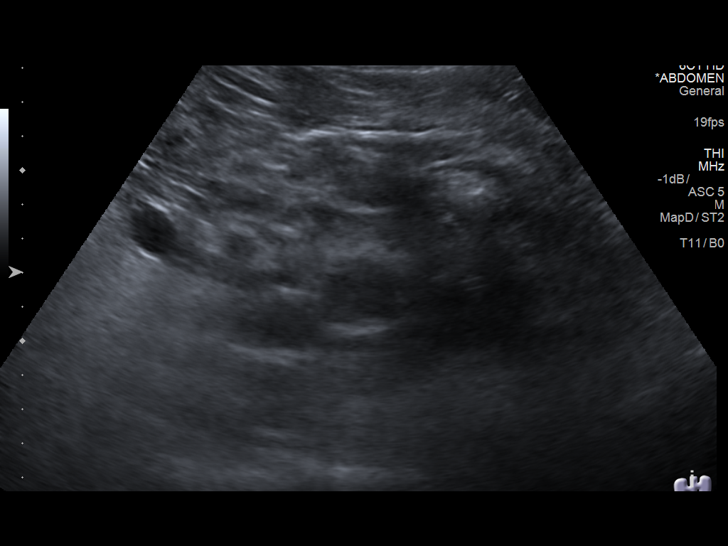
[im 4/13]
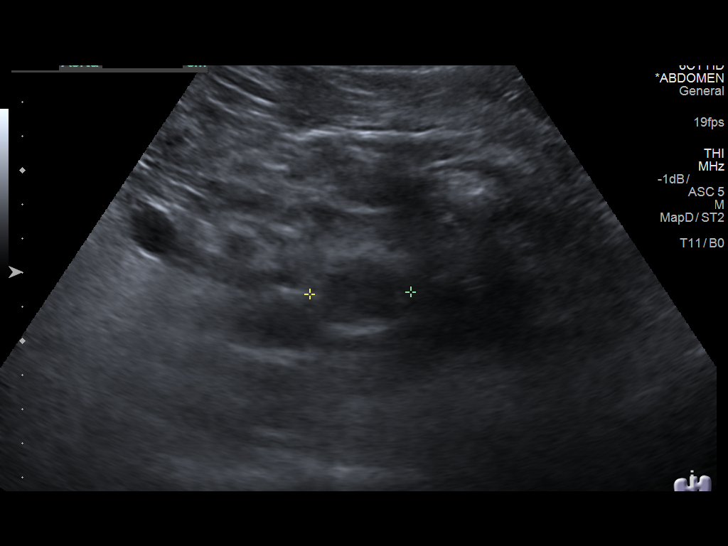
[im 5/13]
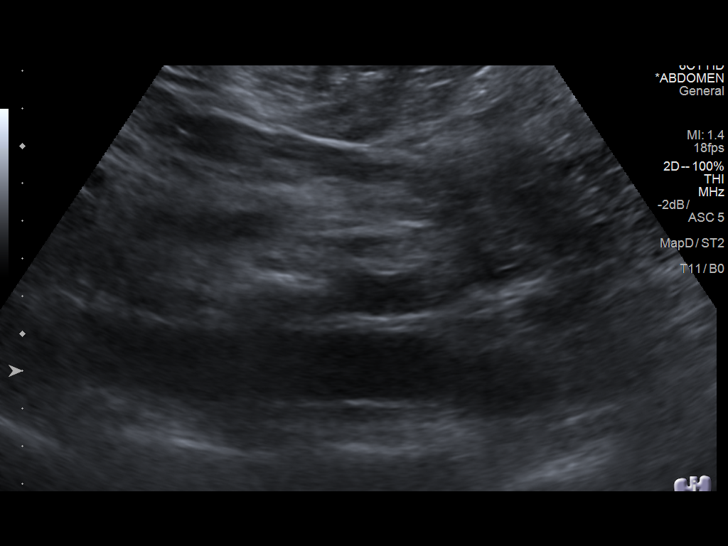
[im 6/13]
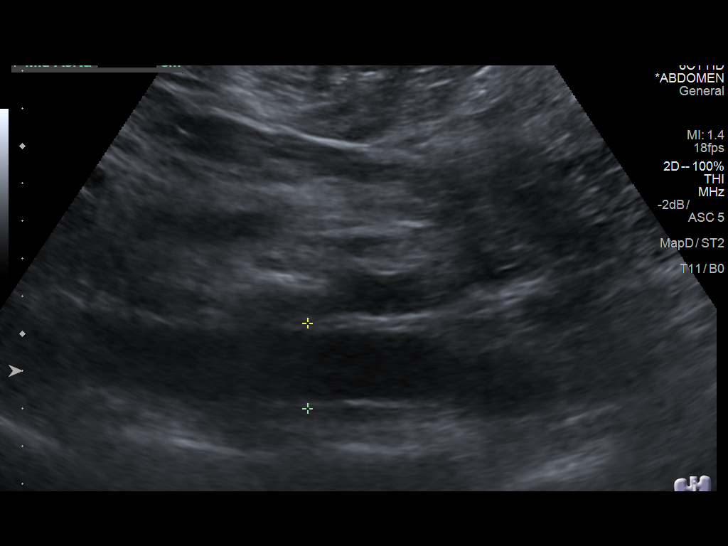
[im 7/13]
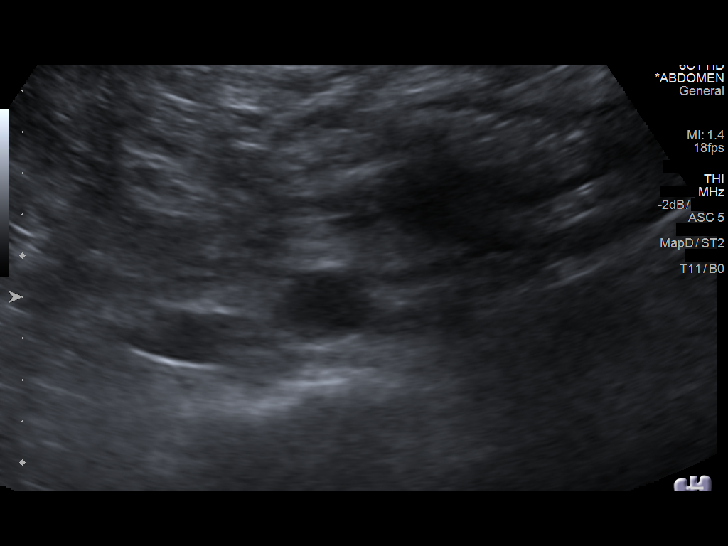
[im 8/13]
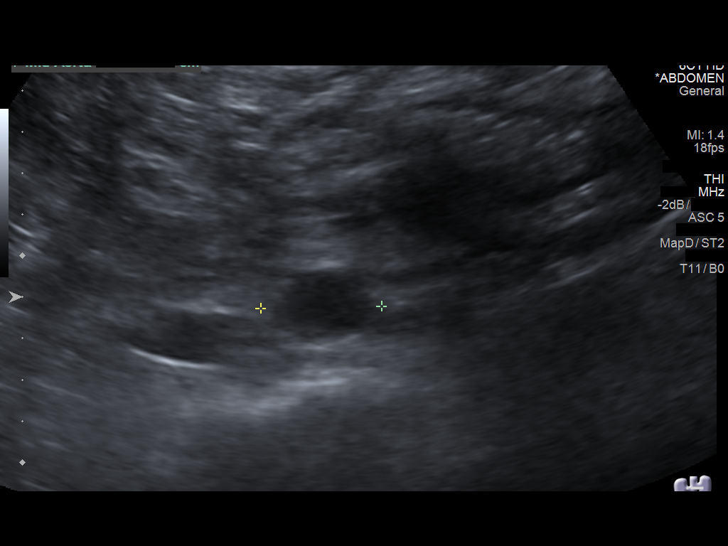
[im 9/13]
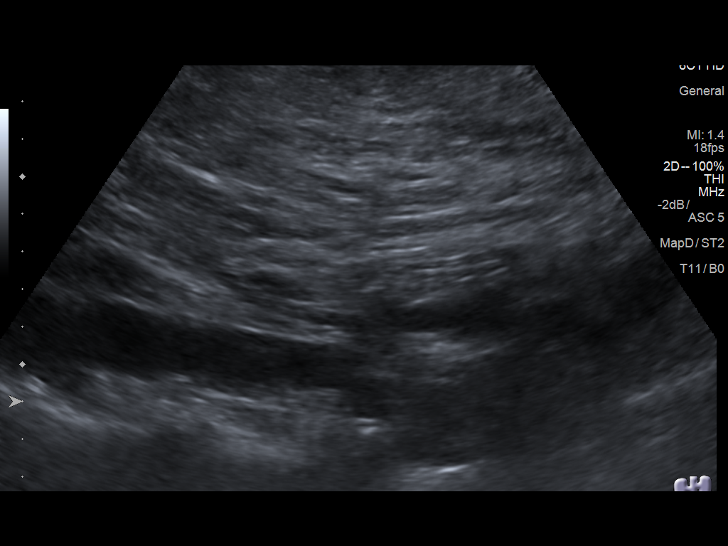
[im 10/13]
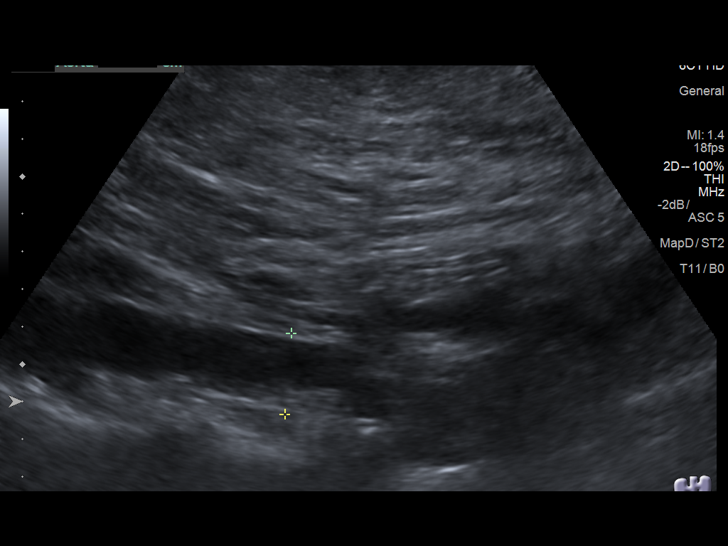
[im 11/13]
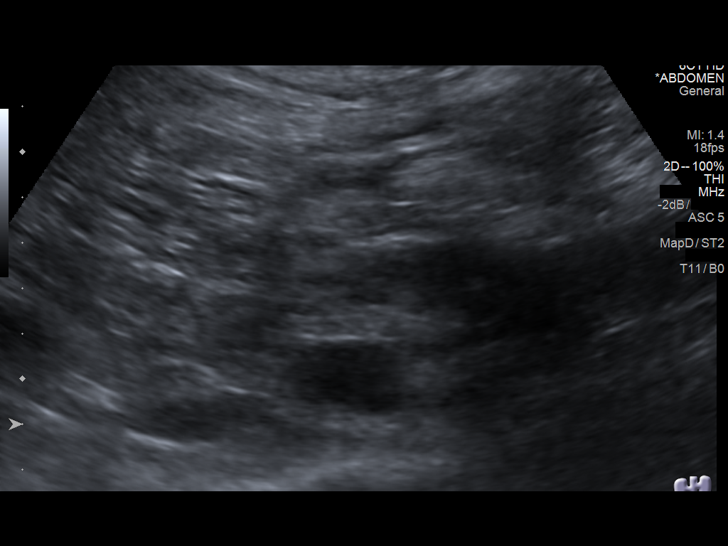
[im 12/13]
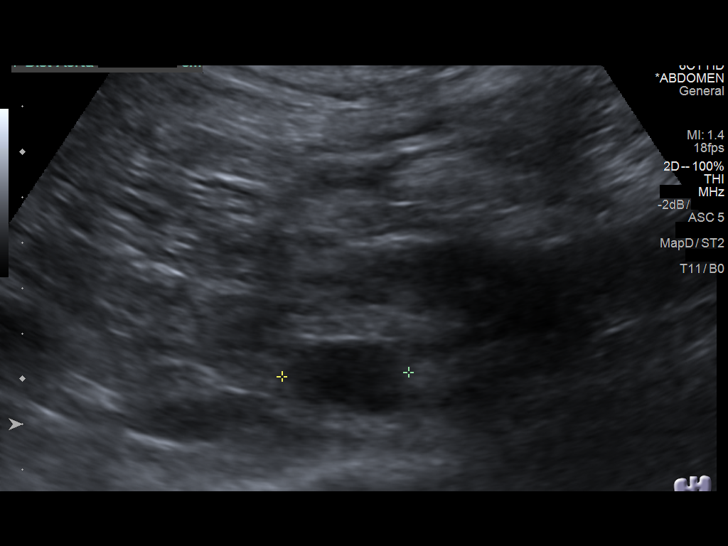
[im 13/13]
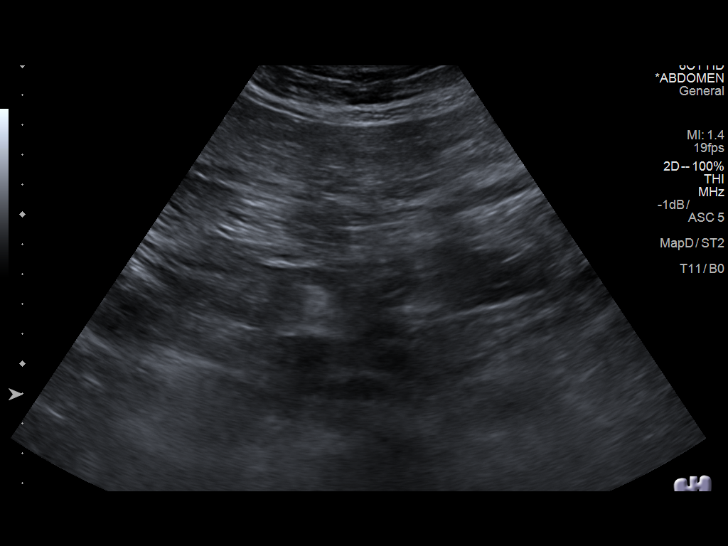

[13 of 13 positions shown; findings below may reference images not displayed]

FINDINGS: Abdominal aortic measurements as follows:

Proximal:  2.7 cm AP/3 cm transverse

Mid: 2.3 cm AP/3 cm transverse

Distal: 2.2 cm AP/2.8 cm transverse
IMPRESSION: No evidence of abdominal aortic aneurysm.

## 2019-10-14 ENCOUNTER — Ambulatory Visit: Payer: Medicare HMO | Admitting: Osteopathic Medicine

## 2019-10-15 ENCOUNTER — Telehealth: Payer: Self-pay

## 2019-10-15 NOTE — Telephone Encounter (Signed)
Pt left a vm msg stating that [employer] has changed the tier marker for metformin. Rx was previously a tier 2 and was initially covered w/o copay. As per pt, rx has now transitioned to Tier 4, which requires PA or for pt to switch to a preferred formulary. Pt is doing well with metformin. Pt mentioned to call [employer] at [phone] to begin PA process. Thanks.

## 2019-10-17 NOTE — Telephone Encounter (Signed)
Pt called requesting an update regarding metformin rx. Pt would like a call back. Thanks.

## 2019-10-20 NOTE — Telephone Encounter (Signed)
Patient is aware that I am working on the PA as quickly as possible. No other questions at this time.

## 2019-10-22 NOTE — Telephone Encounter (Signed)
I have called [employer] and per [place] the tier exception was denied. This is now in the appeal process. Patient is aware. Per Elnita Maxwell this can take up to 7 business days to get a faxed response.

## 2019-10-24 NOTE — Telephone Encounter (Signed)
The tier exception was sent to appeal and the appeal was denied. Do you have any other recommendations? Please advise.

## 2019-10-28 NOTE — Telephone Encounter (Signed)
Would have him check good Rx.  Metformin is not that expensive.

## 2019-10-29 ENCOUNTER — Other Ambulatory Visit: Payer: Self-pay

## 2019-10-29 DIAGNOSIS — E119 Type 2 diabetes mellitus without complications: Secondary | ICD-10-CM

## 2019-10-29 MED ORDER — GLYBURIDE-METFORMIN 2.5-500 MG PO TABS: 2.0000 | ORAL_TABLET | Freq: Two times a day (BID) | ORAL | 0 refills | Status: DC

## 2019-10-29 NOTE — Telephone Encounter (Signed)
Sent refill to [facility].  

## 2019-10-29 NOTE — Telephone Encounter (Signed)
I called patient and advised him of the GoodRx coupon. Sent the coupon by text. Also sent the prescription to [facility].

## 2019-11-04 DIAGNOSIS — E1121 Type 2 diabetes mellitus with diabetic nephropathy: Secondary | ICD-10-CM | POA: Diagnosis not present

## 2019-11-04 DIAGNOSIS — I10 Essential (primary) hypertension: Secondary | ICD-10-CM | POA: Diagnosis not present

## 2019-11-04 DIAGNOSIS — Z0000 Encounter for general adult medical examination without abnormal findings: Secondary | ICD-10-CM | POA: Diagnosis not present

## 2019-11-04 DIAGNOSIS — N183 Chronic kidney disease, stage 3 unspecified: Secondary | ICD-10-CM | POA: Diagnosis not present

## 2019-11-04 DIAGNOSIS — Z23 Encounter for immunization: Secondary | ICD-10-CM | POA: Diagnosis not present

## 2019-11-04 DIAGNOSIS — E785 Hyperlipidemia, unspecified: Secondary | ICD-10-CM | POA: Diagnosis not present

## 2019-11-04 DIAGNOSIS — Z125 Encounter for screening for malignant neoplasm of prostate: Secondary | ICD-10-CM | POA: Diagnosis not present

## 2019-11-04 DIAGNOSIS — D510 Vitamin B12 deficiency anemia due to intrinsic factor deficiency: Secondary | ICD-10-CM | POA: Diagnosis not present

## 2019-11-05 LAB — PSA, TOTAL WITH REFLEX TO PSA, FREE: PSA, Total: 1.1 ng/mL (ref <=4.0)

## 2019-11-05 LAB — COMPLETE METABOLIC PANEL WITH GFR
AG Ratio: 1.5 (calc) (ref 1.0–2.5)
ALT: 20 U/L (ref 9–46)
AST: 18 U/L (ref 10–35)
Albumin: 4.4 g/dL (ref 3.6–5.1)
Alkaline phosphatase (APISO): 71 U/L (ref 35–144)
BUN/Creatinine Ratio: 24 (calc) — ABNORMAL HIGH (ref 6–22)
BUN: 38 mg/dL — ABNORMAL HIGH (ref 7–25)
CO2: 29 mmol/L (ref 20–32)
Calcium: 9.9 mg/dL (ref 8.6–10.3)
Chloride: 103 mmol/L (ref 98–110)
Creat: 1.61 mg/dL — ABNORMAL HIGH (ref 0.70–1.25)
GFR, Est African American: 50 mL/min/{1.73_m2} — ABNORMAL LOW (ref >=60)
GFR, Est Non African American: 43 mL/min/{1.73_m2} — ABNORMAL LOW (ref >=60)
Globulin: 2.9 g/dL (calc) (ref 1.9–3.7)
Glucose, Bld: 167 mg/dL — ABNORMAL HIGH (ref 65–99)
Potassium: 4.4 mmol/L (ref 3.5–5.3)
Sodium: 142 mmol/L (ref 135–146)
Total Bilirubin: 0.8 mg/dL (ref 0.2–1.2)
Total Protein: 7.3 g/dL (ref 6.1–8.1)

## 2019-11-05 LAB — VITAMIN B12: Vitamin B-12: 891 pg/mL (ref 200–1100)

## 2019-11-05 LAB — HEMOGLOBIN A1C
Hgb A1c MFr Bld: 7.2 % of total Hgb — ABNORMAL HIGH (ref <5.7)
Mean Plasma Glucose: 160 (calc)
eAG (mmol/L): 8.9 (calc)

## 2019-11-07 ENCOUNTER — Other Ambulatory Visit: Payer: Self-pay

## 2019-11-07 ENCOUNTER — Ambulatory Visit (INDEPENDENT_AMBULATORY_CARE_PROVIDER_SITE_OTHER): Payer: Medicare HMO | Admitting: Osteopathic Medicine

## 2019-11-07 ENCOUNTER — Encounter: Payer: Self-pay | Admitting: Osteopathic Medicine

## 2019-11-07 VITALS — BP 182/93 | HR 74 | Temp 97.8°F | Wt 286.0 lb

## 2019-11-07 DIAGNOSIS — E1121 Type 2 diabetes mellitus with diabetic nephropathy: Secondary | ICD-10-CM | POA: Diagnosis not present

## 2019-11-07 DIAGNOSIS — Z23 Encounter for immunization: Secondary | ICD-10-CM

## 2019-11-07 DIAGNOSIS — N183 Chronic kidney disease, stage 3 unspecified: Secondary | ICD-10-CM | POA: Diagnosis not present

## 2019-11-07 DIAGNOSIS — E785 Hyperlipidemia, unspecified: Secondary | ICD-10-CM

## 2019-11-07 DIAGNOSIS — D510 Vitamin B12 deficiency anemia due to intrinsic factor deficiency: Secondary | ICD-10-CM | POA: Diagnosis not present

## 2019-11-07 DIAGNOSIS — Z794 Long term (current) use of insulin: Secondary | ICD-10-CM

## 2019-11-07 DIAGNOSIS — E119 Type 2 diabetes mellitus without complications: Secondary | ICD-10-CM

## 2019-11-07 DIAGNOSIS — I10 Essential (primary) hypertension: Secondary | ICD-10-CM

## 2019-11-07 MED ORDER — GLYBURIDE-METFORMIN 2.5-500 MG PO TABS: 2.0000 | ORAL_TABLET | Freq: Every day | ORAL | 0 refills | Status: DC

## 2019-11-07 NOTE — Progress Notes (Signed)
George Spence is a 69 y.o. male who presents to  [place] at [facility]  today, 11/07/19, seeking care for the following:  The primary encounter diagnosis was Essential hypertension. Diagnoses of Controlled type 2 diabetes mellitus with diabetic nephropathy, without long-term current use of insulin ([place]), Stage 3 chronic kidney disease, unspecified whether stage 3a or 3b CKD, Pernicious anemia, Hyperlipidemia, unspecified hyperlipidemia type, Need for influenza vaccination, and Controlled type 2 diabetes mellitus without complication, with long-term current use of insulin ([place]) were also pertinent to this visit.    ASSESSMENT & PLAN with other pertinent history/findings:   1. Essential hypertension Intolerant to most BP meds, we are agreeing to live at a BP above goal d/t QOL impacted by Rx. Pt advised of risk CV disease and renal disease including MI/CVA/HD BP Readings from Last 3 Encounters:  11/07/19 (!) 182/93  04/07/19 (!) 156/83  09/06/18 (!) 170/85    2. Controlled type 2 diabetes mellitus with diabetic nephropathy, without long-term current use of insulin (HCC) A1C up today to 7.2 Was 9.4% in 2015 Work on diet/exercise   3. Stage 3 chronic kidney disease, unspecified whether stage 3a or 3b CKD GFR <45 but only just, pt has cut metformin to half dose few weeks ago, he'd like to stay on current meds and we can reevaluate. Dicsussed HTN may be playing a role here   4. Pernicious anemia B12 looks good!   5. Hyperlipidemia, unspecified hyperlipidemia type LDL at goal      Orders Placed This Encounter  Procedures  . Flu Vaccine QUAD High Dose(Fluad)  . COMPLETE METABOLIC PANEL WITH GFR  . Hemoglobin A1c  . CBC    Meds ordered this encounter  Medications  . glyBURIDE-metformin (GLUCOVANCE) 2.5-500 MG tablet    Sig: Take 2 tablets by mouth daily with breakfast.    Dispense:  360 tablet    Refill:  0       Follow-up  instructions: Return in about 3 months (around 02/04/2020) for recheck labs, isit after w/ Dr Sheppard Coil.        BP (!) 182/93 (BP Location: Left Arm, Patient Position: Sitting, Cuff Size: Large)   Pulse 74   Temp 97.8 F (36.6 C) (Oral)   Wt 286 lb 0.6 oz (129.7 kg)   BMI 38.79 kg/m   No outpatient medications have been marked as taking for the 11/07/19 encounter (Office Visit) with Emeterio Reeve, DO.    Results for orders placed or performed in visit on 04/07/19 (from the past 72 hour(s))  COMPLETE METABOLIC PANEL WITH GFR     Status: Abnormal   Collection Time: 11/04/19 10:32 AM  Result Value Ref Range   Glucose, Bld 167 (H) 65 - 99 mg/dL    Comment: .            Fasting reference interval . For someone without known diabetes, a glucose value >125 mg/dL indicates that they may have diabetes and this should be confirmed with a follow-up test. .    BUN 38 (H) 7 - 25 mg/dL   Creat 1.61 (H) 0.70 - 1.25 mg/dL    Comment: For patients >4 years of age, the reference limit for Creatinine is approximately 13% higher for people identified as African-American. .    GFR, Est Non African American 43 (L) > OR = 60 mL/min/1.49m2   GFR, Est African American 50 (L) > OR = 60 mL/min/1.21m2   BUN/Creatinine Ratio 24 (H) 6 -  22 (calc)   Sodium 142 135 - 146 mmol/L   Potassium 4.4 3.5 - 5.3 mmol/L   Chloride 103 98 - 110 mmol/L   CO2 29 20 - 32 mmol/L   Calcium 9.9 8.6 - 10.3 mg/dL   Total Protein 7.3 6.1 - 8.1 g/dL   Albumin 4.4 3.6 - 5.1 g/dL   Globulin 2.9 1.9 - 3.7 g/dL (calc)   AG Ratio 1.5 1.0 - 2.5 (calc)   Total Bilirubin 0.8 0.2 - 1.2 mg/dL   Alkaline phosphatase (APISO) 71 35 - 144 U/L   AST 18 10 - 35 U/L   ALT 20 9 - 46 U/L  Hemoglobin A1c     Status: Abnormal   Collection Time: 11/04/19 10:32 AM  Result Value Ref Range   Hgb A1c MFr Bld 7.2 (H) <5.7 % of total Hgb    Comment: For someone without known diabetes, a hemoglobin A1c value of 6.5% or greater  indicates that they may have  diabetes and this should be confirmed with a follow-up  test. . For someone with known diabetes, a value <7% indicates  that their diabetes is well controlled and a value  greater than or equal to 7% indicates suboptimal  control. A1c targets should be individualized based on  duration of diabetes, age, comorbid conditions, and  other considerations. . Currently, no consensus exists regarding use of hemoglobin A1c for diagnosis of diabetes for children. .    Mean Plasma Glucose 160 (calc)   eAG (mmol/L) 8.9 (calc)  PSA, Total with Reflex to PSA, Free     Status: None   Collection Time: 11/04/19 10:32 AM  Result Value Ref Range   PSA, Total 1.1 < OR = 4.0 ng/mL    Comment: The Total PSA value from this assay system is  standardized against the equimolar PSA standard.  The test result will be approximately 20% higher  when compared to the [facility] Total PSA  (Siemens assay). Comparison of serial PSA results  should be interpreted with this fact in mind. Marland Kitchen PSA was performed using the Beckman Coulter  Immunoassay method. Values obtained from different  assay methods cannot be used interchangeably. PSA  levels, regardless of value, should not be  interpreted as absolute evidence of the presence or  absence of disease.   Vitamin B12     Status: None   Collection Time: 11/04/19 10:32 AM  Result Value Ref Range   Vitamin B-12 891 200 - 1,100 pg/mL    No results found.  Depression screen [facility] 2/9 04/07/2019 09/06/2018 02/28/2018  Decreased Interest 0 0 0  Down, Depressed, Hopeless 0 0 0  PHQ - 2 Score 0 0 0  Altered sleeping - - 0  Tired, decreased energy - - 0  Change in appetite - - 0  Feeling bad or failure about yourself  - - 0  Trouble concentrating - - 0  Moving slowly or fidgety/restless - - 0  Suicidal thoughts - - 0  PHQ-9 Score - - 0  Difficult doing work/chores - - Not difficult at all    GAD 7 : Generalized Anxiety Score  04/07/2019 02/28/2018  Nervous, Anxious, on Edge 0 0  Control/stop worrying 0 0  Worry too much - different things 0 0  Trouble relaxing 0 0  Restless 0 0  Easily annoyed or irritable 0 0  Afraid - awful might happen 0 0  Total GAD 7 Score 0 0  Anxiety Difficulty - Not difficult at all  All questions at time of visit were answered - patient instructed to contact office with any additional concerns or updates.  ER/RTC precautions were reviewed with the patient.  Please note: voice recognition software was used to produce this document, and typos may escape review. Please contact Dr. Sheppard Coil for any needed clarifications.

## 2019-11-21 ENCOUNTER — Other Ambulatory Visit: Payer: Self-pay | Admitting: Osteopathic Medicine

## 2019-12-04 ENCOUNTER — Encounter: Payer: Self-pay | Admitting: Osteopathic Medicine

## 2020-02-10 DIAGNOSIS — H527 Unspecified disorder of refraction: Secondary | ICD-10-CM | POA: Diagnosis not present

## 2020-02-10 DIAGNOSIS — H02831 Dermatochalasis of right upper eyelid: Secondary | ICD-10-CM | POA: Diagnosis not present

## 2020-02-10 DIAGNOSIS — H25813 Combined forms of age-related cataract, bilateral: Secondary | ICD-10-CM | POA: Diagnosis not present

## 2020-02-10 DIAGNOSIS — H02834 Dermatochalasis of left upper eyelid: Secondary | ICD-10-CM | POA: Diagnosis not present

## 2020-02-10 DIAGNOSIS — H0100B Unspecified blepharitis left eye, upper and lower eyelids: Secondary | ICD-10-CM | POA: Diagnosis not present

## 2020-02-10 DIAGNOSIS — E113293 Type 2 diabetes mellitus with mild nonproliferative diabetic retinopathy without macular edema, bilateral: Secondary | ICD-10-CM | POA: Diagnosis not present

## 2020-02-10 DIAGNOSIS — H0100A Unspecified blepharitis right eye, upper and lower eyelids: Secondary | ICD-10-CM | POA: Diagnosis not present

## 2020-02-10 LAB — HM DIABETES EYE EXAM

## 2020-02-16 ENCOUNTER — Encounter: Payer: Self-pay | Admitting: Osteopathic Medicine

## 2020-02-20 ENCOUNTER — Ambulatory Visit: Payer: Medicare HMO | Admitting: Osteopathic Medicine

## 2020-03-02 DIAGNOSIS — D510 Vitamin B12 deficiency anemia due to intrinsic factor deficiency: Secondary | ICD-10-CM | POA: Diagnosis not present

## 2020-03-02 DIAGNOSIS — N183 Chronic kidney disease, stage 3 unspecified: Secondary | ICD-10-CM | POA: Diagnosis not present

## 2020-03-02 DIAGNOSIS — E785 Hyperlipidemia, unspecified: Secondary | ICD-10-CM | POA: Diagnosis not present

## 2020-03-02 DIAGNOSIS — I10 Essential (primary) hypertension: Secondary | ICD-10-CM | POA: Diagnosis not present

## 2020-03-02 DIAGNOSIS — Z23 Encounter for immunization: Secondary | ICD-10-CM | POA: Diagnosis not present

## 2020-03-02 DIAGNOSIS — E1121 Type 2 diabetes mellitus with diabetic nephropathy: Secondary | ICD-10-CM | POA: Diagnosis not present

## 2020-03-03 LAB — COMPLETE METABOLIC PANEL WITH GFR
AG Ratio: 1.3 (calc) (ref 1.0–2.5)
ALT: 18 U/L (ref 9–46)
AST: 16 U/L (ref 10–35)
Albumin: 4.1 g/dL (ref 3.6–5.1)
Alkaline phosphatase (APISO): 70 U/L (ref 35–144)
BUN/Creatinine Ratio: 20 (calc) (ref 6–22)
BUN: 33 mg/dL — ABNORMAL HIGH (ref 7–25)
CO2: 30 mmol/L (ref 20–32)
Calcium: 9.9 mg/dL (ref 8.6–10.3)
Chloride: 106 mmol/L (ref 98–110)
Creat: 1.61 mg/dL — ABNORMAL HIGH (ref 0.70–1.25)
GFR, Est African American: 50 mL/min/{1.73_m2} — ABNORMAL LOW (ref >=60)
GFR, Est Non African American: 43 mL/min/{1.73_m2} — ABNORMAL LOW (ref >=60)
Globulin: 3.1 g/dL (calc) (ref 1.9–3.7)
Glucose, Bld: 175 mg/dL — ABNORMAL HIGH (ref 65–99)
Potassium: 4.8 mmol/L (ref 3.5–5.3)
Sodium: 142 mmol/L (ref 135–146)
Total Bilirubin: 0.6 mg/dL (ref 0.2–1.2)
Total Protein: 7.2 g/dL (ref 6.1–8.1)

## 2020-03-03 LAB — CBC
HCT: 43.6 % (ref 38.5–50.0)
Hemoglobin: 14.9 g/dL (ref 13.2–17.1)
MCH: 31 pg (ref 27.0–33.0)
MCHC: 34.2 g/dL (ref 32.0–36.0)
MCV: 90.6 fL (ref 80.0–100.0)
MPV: 9.9 fL (ref 7.5–12.5)
Platelets: 284 10*3/uL (ref 140–400)
RBC: 4.81 10*6/uL (ref 4.20–5.80)
RDW: 12.4 % (ref 11.0–15.0)
WBC: 7 10*3/uL (ref 3.8–10.8)

## 2020-03-03 LAB — HEMOGLOBIN A1C
Hgb A1c MFr Bld: 7.2 % of total Hgb — ABNORMAL HIGH (ref <5.7)
Mean Plasma Glucose: 160 (calc)
eAG (mmol/L): 8.9 (calc)

## 2020-03-05 ENCOUNTER — Ambulatory Visit (INDEPENDENT_AMBULATORY_CARE_PROVIDER_SITE_OTHER): Payer: Medicare HMO | Admitting: Osteopathic Medicine

## 2020-03-05 ENCOUNTER — Other Ambulatory Visit: Payer: Self-pay

## 2020-03-05 ENCOUNTER — Encounter: Payer: Self-pay | Admitting: Osteopathic Medicine

## 2020-03-05 VITALS — BP 150/83 | HR 67 | Wt 257.0 lb

## 2020-03-05 DIAGNOSIS — E1121 Type 2 diabetes mellitus with diabetic nephropathy: Secondary | ICD-10-CM | POA: Diagnosis not present

## 2020-03-05 DIAGNOSIS — I10 Essential (primary) hypertension: Secondary | ICD-10-CM | POA: Diagnosis not present

## 2020-03-05 DIAGNOSIS — N183 Chronic kidney disease, stage 3 unspecified: Secondary | ICD-10-CM | POA: Diagnosis not present

## 2020-03-05 DIAGNOSIS — E785 Hyperlipidemia, unspecified: Secondary | ICD-10-CM | POA: Diagnosis not present

## 2020-03-05 DIAGNOSIS — D510 Vitamin B12 deficiency anemia due to intrinsic factor deficiency: Secondary | ICD-10-CM | POA: Diagnosis not present

## 2020-03-05 NOTE — Progress Notes (Signed)
George Spence is a 69 y.o. male who presents to  [facility]  today, 03/05/20, seeking care for the following: . Follow-up on labs: A1c, renal function, B12.     ASSESSMENT & PLAN with other pertinent history/findings:  The primary encounter diagnosis was Essential hypertension. Diagnoses of Controlled type 2 diabetes mellitus with diabetic nephropathy, without long-term current use of insulin (HCC), Stage 3 chronic kidney disease, unspecified whether stage 3a or 3b CKD, Pernicious anemia, and Hyperlipidemia, unspecified hyperlipidemia type were also pertinent to this visit.  Renal function stable We will recheck in 6 months      Follow-up instructions: Return in about 6 months (around 09/04/2020) for [place] (call week prior to visit for lab orders).  Labs to order diagnoses include annual physical, CKD 3, hypertension, type 2 diabetes, pernicious anemia CBC, CMP, B12, lipids, A1c                                       BP (!) 150/83 (BP Location: Right Arm, Patient Position: Sitting)   Pulse 67   Wt 257 lb (116.6 kg)   BMI 34.86 kg/m   Current Meds  Medication Sig  . atorvastatin (LIPITOR) 40 MG tablet Take 1 tablet (40 mg total) by mouth daily.  . Cyanocobalamin (B-12) 1000 MCG CAPS Take 2,000 mcg by mouth daily.  Marland Kitchen econazole nitrate 1 % cream Apply topically daily. To nail fungal infection  . glyBURIDE-metformin (GLUCOVANCE) 2.5-500 MG tablet Take 2 tablets by mouth daily with breakfast.  . hydrochlorothiazide (HYDRODIURIL) 12.5 MG tablet Take 1 tablet (12.5 mg total) by mouth daily.  . irbesartan (AVAPRO) 150 MG tablet TAKE 1 TABLET DAILY. DUE   FOR FOLLOW UP VISIT WITH   PRIMARY CARE PHYSICIAN    No results found for this or any previous visit (from the past 72 hour(s)).  No results found.  Depression screen [facility] 2/9 04/07/2019 09/06/2018 02/28/2018  Decreased Interest 0 0 0   Down, Depressed, Hopeless 0 0 0  PHQ - 2 Score 0 0 0  Altered sleeping - - 0  Tired, decreased energy - - 0  Change in appetite - - 0  Feeling bad or failure about yourself  - - 0  Trouble concentrating - - 0  Moving slowly or fidgety/restless - - 0  Suicidal thoughts - - 0  PHQ-9 Score - - 0  Difficult doing work/chores - - Not difficult at all    GAD 7 : Generalized Anxiety Score 04/07/2019 02/28/2018  Nervous, Anxious, on Edge 0 0  Control/stop worrying 0 0  Worry too much - different things 0 0  Trouble relaxing 0 0  Restless 0 0  Easily annoyed or irritable 0 0  Afraid - awful might happen 0 0  Total GAD 7 Score 0 0  Anxiety Difficulty - Not difficult at all      All questions at time of visit were answered - patient instructed to contact office with any additional concerns or updates.  ER/RTC precautions were reviewed with the patient.  Please note: voice recognition software was used to produce this document, and typos may escape review. Please contact Dr. Lyn Hollingshead for any needed clarifications.

## 2020-04-01 DIAGNOSIS — L578 Other skin changes due to chronic exposure to nonionizing radiation: Secondary | ICD-10-CM | POA: Diagnosis not present

## 2020-04-01 DIAGNOSIS — L570 Actinic keratosis: Secondary | ICD-10-CM | POA: Diagnosis not present

## 2020-05-04 ENCOUNTER — Other Ambulatory Visit: Payer: Self-pay

## 2020-05-04 DIAGNOSIS — E119 Type 2 diabetes mellitus without complications: Secondary | ICD-10-CM

## 2020-05-04 DIAGNOSIS — I10 Essential (primary) hypertension: Secondary | ICD-10-CM

## 2020-05-04 DIAGNOSIS — Z794 Long term (current) use of insulin: Secondary | ICD-10-CM

## 2020-05-04 MED ORDER — HYDROCHLOROTHIAZIDE 12.5 MG PO TABS: 12.5000 mg | ORAL_TABLET | Freq: Every day | ORAL | 3 refills | Status: DC

## 2020-05-04 MED ORDER — ATORVASTATIN CALCIUM 40 MG PO TABS: 40.0000 mg | ORAL_TABLET | Freq: Every day | ORAL | 3 refills | Status: DC

## 2020-05-04 MED ORDER — GLYBURIDE-METFORMIN 2.5-500 MG PO TABS: 2.0000 | ORAL_TABLET | Freq: Every day | ORAL | 0 refills | Status: DC

## 2020-05-04 MED ORDER — IRBESARTAN 150 MG PO TABS: ORAL_TABLET | ORAL | 1 refills | Status: DC

## 2020-05-08 ENCOUNTER — Other Ambulatory Visit: Payer: Self-pay | Admitting: Osteopathic Medicine

## 2020-05-08 DIAGNOSIS — E119 Type 2 diabetes mellitus without complications: Secondary | ICD-10-CM

## 2020-06-08 ENCOUNTER — Encounter: Payer: Self-pay | Admitting: Osteopathic Medicine

## 2020-08-02 DIAGNOSIS — R69 Illness, unspecified: Secondary | ICD-10-CM | POA: Diagnosis not present

## 2020-09-30 ENCOUNTER — Ambulatory Visit: Payer: Medicare HMO | Admitting: Osteopathic Medicine

## 2020-10-08 ENCOUNTER — Other Ambulatory Visit: Payer: Self-pay | Admitting: Osteopathic Medicine

## 2020-10-08 DIAGNOSIS — E119 Type 2 diabetes mellitus without complications: Secondary | ICD-10-CM

## 2020-10-08 DIAGNOSIS — Z794 Long term (current) use of insulin: Secondary | ICD-10-CM

## 2020-11-29 ENCOUNTER — Other Ambulatory Visit: Payer: Self-pay

## 2020-11-29 DIAGNOSIS — I10 Essential (primary) hypertension: Secondary | ICD-10-CM

## 2020-11-29 MED ORDER — IRBESARTAN 150 MG PO TABS: ORAL_TABLET | ORAL | 1 refills | Status: DC

## 2020-12-13 ENCOUNTER — Ambulatory Visit: Payer: Medicare HMO | Admitting: Osteopathic Medicine

## 2021-02-25 ENCOUNTER — Encounter: Payer: Medicare HMO | Admitting: Osteopathic Medicine

## 2021-03-09 ENCOUNTER — Encounter: Payer: Medicare HMO | Admitting: Osteopathic Medicine

## 2021-03-20 ENCOUNTER — Other Ambulatory Visit: Payer: Self-pay | Admitting: Osteopathic Medicine

## 2021-03-20 DIAGNOSIS — I10 Essential (primary) hypertension: Secondary | ICD-10-CM

## 2021-03-20 DIAGNOSIS — E119 Type 2 diabetes mellitus without complications: Secondary | ICD-10-CM

## 2021-03-20 DIAGNOSIS — Z794 Long term (current) use of insulin: Secondary | ICD-10-CM

## 2021-04-06 ENCOUNTER — Encounter: Payer: Medicare HMO | Admitting: Osteopathic Medicine

## 2021-04-19 ENCOUNTER — Other Ambulatory Visit: Payer: Self-pay | Admitting: Osteopathic Medicine

## 2021-04-19 DIAGNOSIS — Z794 Long term (current) use of insulin: Secondary | ICD-10-CM

## 2021-04-19 DIAGNOSIS — I10 Essential (primary) hypertension: Secondary | ICD-10-CM

## 2021-04-19 DIAGNOSIS — E119 Type 2 diabetes mellitus without complications: Secondary | ICD-10-CM

## 2021-05-02 ENCOUNTER — Telehealth: Payer: Self-pay | Admitting: Osteopathic Medicine

## 2021-05-02 DIAGNOSIS — Z794 Long term (current) use of insulin: Secondary | ICD-10-CM

## 2021-05-02 DIAGNOSIS — I10 Essential (primary) hypertension: Secondary | ICD-10-CM

## 2021-05-02 DIAGNOSIS — E119 Type 2 diabetes mellitus without complications: Secondary | ICD-10-CM

## 2021-05-02 DIAGNOSIS — E785 Hyperlipidemia, unspecified: Secondary | ICD-10-CM

## 2021-05-02 DIAGNOSIS — D510 Vitamin B12 deficiency anemia due to intrinsic factor deficiency: Secondary | ICD-10-CM

## 2021-05-02 DIAGNOSIS — N183 Chronic kidney disease, stage 3 unspecified: Secondary | ICD-10-CM

## 2021-05-02 NOTE — Telephone Encounter (Signed)
Lab orders are in The labs will dar however many vials they need based on orders.. Can walk in to lab downstairs whenever they're open and he's available

## 2021-05-02 NOTE — Telephone Encounter (Signed)
Pt called. He has a physical scheduled on August 9th and wants lab order sent downstairs on Friday. He also said to make sure there are 4 vials of blood drawn because last time there were only 3.  Thank you.

## 2021-05-06 DIAGNOSIS — Z794 Long term (current) use of insulin: Secondary | ICD-10-CM | POA: Diagnosis not present

## 2021-05-06 DIAGNOSIS — E785 Hyperlipidemia, unspecified: Secondary | ICD-10-CM | POA: Diagnosis not present

## 2021-05-06 DIAGNOSIS — Z125 Encounter for screening for malignant neoplasm of prostate: Secondary | ICD-10-CM | POA: Diagnosis not present

## 2021-05-06 DIAGNOSIS — D510 Vitamin B12 deficiency anemia due to intrinsic factor deficiency: Secondary | ICD-10-CM | POA: Diagnosis not present

## 2021-05-06 DIAGNOSIS — E119 Type 2 diabetes mellitus without complications: Secondary | ICD-10-CM | POA: Diagnosis not present

## 2021-05-06 DIAGNOSIS — N183 Chronic kidney disease, stage 3 unspecified: Secondary | ICD-10-CM | POA: Diagnosis not present

## 2021-05-06 DIAGNOSIS — I10 Essential (primary) hypertension: Secondary | ICD-10-CM | POA: Diagnosis not present

## 2021-05-09 DIAGNOSIS — L570 Actinic keratosis: Secondary | ICD-10-CM | POA: Diagnosis not present

## 2021-05-09 DIAGNOSIS — B351 Tinea unguium: Secondary | ICD-10-CM | POA: Diagnosis not present

## 2021-05-09 DIAGNOSIS — L578 Other skin changes due to chronic exposure to nonionizing radiation: Secondary | ICD-10-CM | POA: Diagnosis not present

## 2021-05-09 DIAGNOSIS — L820 Inflamed seborrheic keratosis: Secondary | ICD-10-CM | POA: Diagnosis not present

## 2021-05-09 LAB — COMPLETE METABOLIC PANEL WITH GFR
AG Ratio: 1.3 (calc) (ref 1.0–2.5)
ALT: 20 U/L (ref 9–46)
AST: 18 U/L (ref 10–35)
Albumin: 4.1 g/dL (ref 3.6–5.1)
Alkaline phosphatase (APISO): 77 U/L (ref 35–144)
BUN/Creatinine Ratio: 21 (calc) (ref 6–22)
BUN: 33 mg/dL — ABNORMAL HIGH (ref 7–25)
CO2: 28 mmol/L (ref 20–32)
Calcium: 9.1 mg/dL (ref 8.6–10.3)
Chloride: 101 mmol/L (ref 98–110)
Creat: 1.59 mg/dL — ABNORMAL HIGH (ref 0.70–1.28)
Globulin: 3.2 g/dL (calc) (ref 1.9–3.7)
Glucose, Bld: 176 mg/dL — ABNORMAL HIGH (ref 65–99)
Potassium: 4.2 mmol/L (ref 3.5–5.3)
Sodium: 139 mmol/L (ref 135–146)
Total Bilirubin: 0.7 mg/dL (ref 0.2–1.2)
Total Protein: 7.3 g/dL (ref 6.1–8.1)
eGFR: 46 mL/min/{1.73_m2} — ABNORMAL LOW (ref >=60)

## 2021-05-09 LAB — CBC
HCT: 45.1 % (ref 38.5–50.0)
Hemoglobin: 15.1 g/dL (ref 13.2–17.1)
MCH: 29.9 pg (ref 27.0–33.0)
MCHC: 33.5 g/dL (ref 32.0–36.0)
MCV: 89.3 fL (ref 80.0–100.0)
MPV: 10.2 fL (ref 7.5–12.5)
Platelets: 228 10*3/uL (ref 140–400)
RBC: 5.05 10*6/uL (ref 4.20–5.80)
RDW: 12.4 % (ref 11.0–15.0)
WBC: 6.7 10*3/uL (ref 3.8–10.8)

## 2021-05-09 LAB — HEMOGLOBIN A1C
Hgb A1c MFr Bld: 8.5 % of total Hgb — ABNORMAL HIGH (ref <5.7)
Mean Plasma Glucose: 197 mg/dL
eAG (mmol/L): 10.9 mmol/L

## 2021-05-09 LAB — LIPID PANEL
Cholesterol: 110 mg/dL (ref <200)
HDL: 31 mg/dL — ABNORMAL LOW (ref >=40)
LDL Cholesterol (Calc): 57 mg/dL (calc)
Non-HDL Cholesterol (Calc): 79 mg/dL (calc) (ref <130)
Total CHOL/HDL Ratio: 3.5 (calc) (ref <5.0)
Triglycerides: 134 mg/dL (ref <150)

## 2021-05-09 LAB — PSA, TOTAL WITH REFLEX TO PSA, FREE: PSA, Total: 1.6 ng/mL (ref <=4.0)

## 2021-05-09 LAB — VITAMIN B12: Vitamin B-12: 475 pg/mL (ref 200–1100)

## 2021-05-10 ENCOUNTER — Encounter: Payer: Self-pay | Admitting: Osteopathic Medicine

## 2021-05-10 ENCOUNTER — Other Ambulatory Visit: Payer: Self-pay

## 2021-05-10 ENCOUNTER — Ambulatory Visit (INDEPENDENT_AMBULATORY_CARE_PROVIDER_SITE_OTHER): Payer: Medicare HMO | Admitting: Osteopathic Medicine

## 2021-05-10 VITALS — BP 147/79 | HR 106 | Ht 72.0 in | Wt 285.0 lb

## 2021-05-10 DIAGNOSIS — E785 Hyperlipidemia, unspecified: Secondary | ICD-10-CM

## 2021-05-10 DIAGNOSIS — N183 Chronic kidney disease, stage 3 unspecified: Secondary | ICD-10-CM | POA: Diagnosis not present

## 2021-05-10 DIAGNOSIS — Z794 Long term (current) use of insulin: Secondary | ICD-10-CM | POA: Diagnosis not present

## 2021-05-10 DIAGNOSIS — D510 Vitamin B12 deficiency anemia due to intrinsic factor deficiency: Secondary | ICD-10-CM

## 2021-05-10 DIAGNOSIS — Z0000 Encounter for general adult medical examination without abnormal findings: Secondary | ICD-10-CM | POA: Diagnosis not present

## 2021-05-10 DIAGNOSIS — E119 Type 2 diabetes mellitus without complications: Secondary | ICD-10-CM | POA: Diagnosis not present

## 2021-05-10 DIAGNOSIS — I10 Essential (primary) hypertension: Secondary | ICD-10-CM | POA: Diagnosis not present

## 2021-05-10 MED ORDER — ATORVASTATIN CALCIUM 40 MG PO TABS: 40.0000 mg | ORAL_TABLET | Freq: Every day | ORAL | 3 refills | Status: DC

## 2021-05-10 MED ORDER — HYDROCHLOROTHIAZIDE 12.5 MG PO TABS: 12.5000 mg | ORAL_TABLET | Freq: Every day | ORAL | 3 refills | Status: DC

## 2021-05-10 MED ORDER — GLYBURIDE-METFORMIN 2.5-500 MG PO TABS: 2.0000 | ORAL_TABLET | Freq: Every day | ORAL | 3 refills | Status: DC

## 2021-05-10 MED ORDER — IRBESARTAN 150 MG PO TABS: ORAL_TABLET | ORAL | 3 refills | Status: DC

## 2021-05-10 MED ORDER — B-12 1000 MCG PO CAPS: 3000.0000 ug | ORAL_CAPSULE | Freq: Every day | ORAL | 3 refills | Status: AC

## 2021-05-10 MED ORDER — GLYBURIDE-METFORMIN 2.5-500 MG PO TABS: 2.0000 | ORAL_TABLET | Freq: Two times a day (BID) | ORAL | 3 refills | Status: DC

## 2021-05-10 NOTE — Progress Notes (Signed)
George Spence is a 70 y.o. male who presents to  [place] at [facility]  today, 05/10/21, seeking care for the following:  Annual physical  Monitor chronic problems as below    [place] with other pertinent findings:  The primary encounter diagnosis was Annual physical exam. Diagnoses of Controlled type 2 diabetes mellitus without complication, with long-term current use of insulin ([place]), Essential hypertension, Stage 3 chronic kidney disease, unspecified whether stage 3a or 3b CKD ([place]), Hyperlipidemia, unspecified hyperlipidemia type, and Pernicious anemia were also pertinent to this visit.  1. Annual physical exam Due for DEXA based on age, otherwise ok   2. Controlled type 2 diabetes mellitus without complication, with long-term current use of insulin (HCC) A1C above goal, has only been taking 1 of the Glucovance d/t concerns over renal function w/ metformin he read about, reassured given labs i'm more worried about undonctolled DM2 causing renal disease, ok to increase Rx, he'd like to go to 2 pills daily and see how A1C looks in a few mos, reasonable   3. Essential hypertension Not at goal but he's reluctant to change meds d/t side effects from antihypertensives in the past   4. Stage 3 chronic kidney disease, unspecified whether stage 3a or 3b CKD ([place]) stable  5. Hyperlipidemia, unspecified hyperlipidemia type LDL at goal  6. Pernicious anemia B12 ok, can increase to 3000 mcg daily    Patient Instructions  General Preventive Care Most recent routine screening labs: see attached!  Blood pressure goal 130/80 or less.  Tobacco: don't!  Alcohol: responsible moderation is ok for most adults - if you have concerns about your alcohol intake, please talk to me!  Exercise: as tolerated to reduce risk of cardiovascular disease and diabetes. Strength training will also prevent osteoporosis.  Mental health: if need for mental  health care (medicines, counseling, other), or concerns about moods, please let me know!  Sexual / Reproductive health: if need for STD testing, or if concerns with libido/pain problems, please let me know!  Advanced Directive: Living Will and/or Healthcare Power of Attorney recommended for all adults, regardless of age or health.  Vaccines Flu vaccine: recommended every fall.  Shingles vaccine: all done!  Pneumonia vaccines: all done! Tetanus booster: every 10 years due 04/2027 COVID vaccine: THANKS for getting your vaccine! :) BOOSTER(S) STRONGLY RECOMMENDED  Cancer screenings  Colon cancer screening: Cologuard stool test due 07/2022 Prostate cancer screening: PSA blood test age 5+ Lung cancer screening: not needed since quit >15 years ago Infection screenings  HIV: recommended screening at least once age 61-65 Gonorrhea/Chlamydia: screening as needed Hepatitis C: recommended once for everyone age 95-18 TB: certain at-risk populations, or depending on work requirements and/or travel history Other Bone Density Test: recommended for men at age 36 Abdominal Aortic Aneurysm: screening with ultrasound recommended once for men age 51-75 who have ever smoked - DONE!         FYI to my patients: After six years here, I will be leaving practice at [facility]. My last day here will be 07/01/2021. I will continue to provide your care up until that date, and you will still be considered a patient here after that as long as you want to be!    You will get a letter in the mail explaining details, but after 07/01/2021, my patients have several options to continue care:  1) you can establish care with Dr. Luetta Nutting or Caryl Asp  Jessup NP, who are accepting new patients here and are absorbing many folks from my current patient panel, OR...  2) you can see any available provider here on as-needed basis until my official replacement starts (hiring a new doctor has not been  finalized yet), OR.Marland KitchenMarland Kitchen 3) if you choose to seek care elsewhere, this office will be happy to facilitate transfer of records, and will refill medications on a case-by-case basis.   It is bittersweet to leave! 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 I have truly enjoyed taking care of folks here, but I am also excited for my next adventure doing something a bit different. Take care, and please let us know if you have any questions!   -Dr. Loni Muse.     Orders Placed This Encounter  Procedures   DG Bone Density    Meds ordered this encounter  Medications   atorvastatin (LIPITOR) 40 MG tablet    Sig: Take 1 tablet (40 mg total) by mouth daily.    Dispense:  90 tablet    Refill:  3   hydrochlorothiazide (HYDRODIURIL) 12.5 MG tablet    Sig: Take 1 tablet (12.5 mg total) by mouth daily.    Dispense:  90 tablet    Refill:  3   irbesartan (AVAPRO) 150 MG tablet    Sig: TAKE 1 TABLET DAILY.    Dispense:  90 tablet    Refill:  3   DISCONTD: glyBURIDE-metformin (GLUCOVANCE) 2.5-500 MG tablet    Sig: Take 2 tablets by mouth 2 (two) times daily with a meal.    Dispense:  360 tablet    Refill:  3   glyBURIDE-metformin (GLUCOVANCE) 2.5-500 MG tablet    Sig: Take 2 tablets by mouth daily with breakfast.    Dispense:  360 tablet    Refill:  3   Cyanocobalamin (B-12) 1000 MCG CAPS    Sig: Take 3,000 mcg by mouth daily.    Dispense:  270 capsule    Refill:  3     See below for relevant physical exam findings  See below for recent lab and imaging results reviewed  Medications, allergies, PMH, PSH, SocH, FamH reviewed below    Follow-up instructions: Return in about 4 months (around 09/09/2021) for monitor A1C w/ Dr Zigmund Daniel - after that can establish w/ Dr Jerilynn Mages or w/ my replacement .                                        Exam:  BP (!)  147/79   Pulse (!) 106   Ht 6' (1.829 m)   Wt 285 lb (129.3 kg)   BMI 38.65 kg/m  Constitutional: VS see above. General Appearance: alert, well-developed, well-nourished, NAD Neck: No masses, trachea midline.  Respiratory: Normal respiratory effort. no wheeze, no rhonchi, no rales Cardiovascular: S1/S2 normal, no murmur, no rub/gallop auscultated. RRR.  Musculoskeletal: Gait normal. Symmetric and independent movement of all extremities Abdominal: non-tender, non-distended, no appreciable organomegaly, neg Murphy's, BS [id number] Neurological: Normal balance/coordination. No tremor. Skin: warm, dry, intact.  Psychiatric: Normal judgment/insight. Normal mood and affect. Oriented x3.   No outpatient medications have been marked as taking for the 05/10/21 encounter (Office Visit) with Emeterio Reeve, DO.    No Known Allergies  Patient Active Problem List  Diagnosis Date Noted   Former smoker 02/28/2018   Photosensitivity 02/28/2018   Watery eyes 02/28/2018   Pernicious anemia 02/20/2017   CKD (chronic kidney disease) stage 3, GFR 30-59 ml/min (HCC) 02/12/2017   Macrocytosis without anemia 11/13/2016   Diabetes type 2, controlled ([place]) 08/13/2014   Essential hypertension 08/12/2014   Hyperlipidemia 08/12/2014    No family history on file.  Social History   Tobacco Use  Smoking Status Former   Types: Cigarettes   Quit date: 08/13/1979   Years since quitting: 41.7  Smokeless Tobacco Never    No past surgical history on file.  Immunization History  Administered Date(s) Administered   Fluad Quad(high Dose 65+) 11/07/2019   Influenza, High Dose Seasonal PF 06/28/2018   Influenza,inj,Quad PF,6+ Mos 10/30/2016, 07/19/2017   Influenza-Unspecified 07/02/2014, 07/03/2015   Moderna Sars-Covid-2 Vaccination 10/28/2019   Pneumococcal Conjugate-13 10/30/2016   Pneumococcal Polysaccharide-23 04/07/2019   Tdap 04/18/2017   Zoster Recombinat (Shingrix) 06/28/2018, 09/03/2018    Zoster, Live 10/15/2015    Recent Results (from the past 2160 hour(s))  CBC     Status: None   Collection Time: 05/06/21 11:03 AM  Result Value Ref Range   WBC 6.7 3.8 - 10.8 Thousand/uL   RBC 5.05 4.20 - 5.80 Million/uL   Hemoglobin 15.1 13.2 - 17.1 g/dL   HCT 45.1 38.5 - 50.0 %   MCV 89.3 80.0 - 100.0 fL   MCH 29.9 27.0 - 33.0 pg   MCHC 33.5 32.0 - 36.0 g/dL   RDW 12.4 11.0 - 15.0 %   Platelets 228 140 - 400 Thousand/uL   MPV 10.2 7.5 - 12.5 fL  COMPLETE METABOLIC PANEL WITH GFR     Status: Abnormal   Collection Time: 05/06/21 11:03 AM  Result Value Ref Range   Glucose, Bld 176 (H) 65 - 99 mg/dL    Comment: .            Fasting reference interval . For someone without known diabetes, a glucose value >125 mg/dL indicates that they may have diabetes and this should be confirmed with a follow-up test. .    BUN 33 (H) 7 - 25 mg/dL   Creat 1.59 (H) 0.70 - 1.28 mg/dL   eGFR 46 (L) > OR = 60 mL/min/1.34m    Comment: The eGFR is based on the CKD-EPI 2021 equation. To calculate  the new eGFR from a previous Creatinine or Cystatin C result, go to https://www.kidney.org/professionals/ kdoqi/gfr%5Fcalculator    BUN/Creatinine Ratio 21 6 - 22 (calc)   Sodium 139 135 - 146 mmol/L   Potassium 4.2 3.5 - 5.3 mmol/L   Chloride 101 98 - 110 mmol/L   CO2 28 20 - 32 mmol/L   Calcium 9.1 8.6 - 10.3 mg/dL   Total Protein 7.3 6.1 - 8.1 g/dL   Albumin 4.1 3.6 - 5.1 g/dL   Globulin 3.2 1.9 - 3.7 g/dL (calc)   AG Ratio 1.3 1.0 - 2.5 (calc)   Total Bilirubin 0.7 0.2 - 1.2 mg/dL   Alkaline phosphatase (APISO) 77 35 - 144 U/L   AST 18 10 - 35 U/L   ALT 20 9 - 46 U/L  Lipid panel     Status: Abnormal   Collection Time: 05/06/21 11:03 AM  Result Value Ref Range   Cholesterol 110 <200 mg/dL   HDL 31 (L) > OR = 40 mg/dL   Triglycerides 134 <150 mg/dL   LDL Cholesterol (Calc) 57 mg/dL (calc)    Comment: Reference range: <100 .  Desirable range <100 mg/dL for primary prevention;   <70  mg/dL for patients with CHD or diabetic patients  with > or = 2 CHD risk factors. Marland Kitchen LDL-C is now calculated using the Martin-Hopkins  calculation, which is a validated novel method providing  better accuracy than the Friedewald equation in the  estimation of LDL-C.  Cresenciano Genre et al. Annamaria Helling. 2094;709(62): 2061-2068  (http://education.Quest[employer].com/faq/FAQ164)    Total CHOL/HDL Ratio 3.5 <5.0 (calc)   Non-HDL Cholesterol (Calc) 79 <130 mg/dL (calc)    Comment: For patients with diabetes plus 1 major ASCVD risk  factor, treating to a non-HDL-C goal of <100 mg/dL  (LDL-C of <70 mg/dL) is considered a therapeutic  option.   Hemoglobin A1c     Status: Abnormal   Collection Time: 05/06/21 11:03 AM  Result Value Ref Range   Hgb A1c MFr Bld 8.5 (H) <5.7 % of total Hgb    Comment: For someone without known diabetes, a hemoglobin A1c value of 6.5% or greater indicates that they may have  diabetes and this should be confirmed with a follow-up  test. . For someone with known diabetes, a value <7% indicates  that their diabetes is well controlled and a value  greater than or equal to 7% indicates suboptimal  control. A1c targets should be individualized based on  duration of diabetes, age, comorbid conditions, and  other considerations. . Currently, no consensus exists regarding use of hemoglobin A1c for diagnosis of diabetes for children. .    Mean Plasma Glucose 197 mg/dL   eAG (mmol/L) 10.9 mmol/L  PSA, Total with Reflex to PSA, Free     Status: None   Collection Time: 05/06/21 11:03 AM  Result Value Ref Range   PSA, Total 1.6 < OR = 4.0 ng/mL    Comment: The Total PSA value from this assay system is  standardized against the equimolar PSA standard.  The test result will be approximately 20% higher  when compared to the [facility] Total PSA  (Siemens assay). Comparison of serial PSA results  should be interpreted with this fact in mind. Marland Kitchen PSA was performed using the  Beckman Coulter  Immunoassay method. Values obtained from different  assay methods cannot be used interchangeably. PSA  levels, regardless of value, should not be  interpreted as absolute evidence of the presence or  absence of disease.   Vitamin B12     Status: None   Collection Time: 05/06/21 11:03 AM  Result Value Ref Range   Vitamin B-12 475 200 - 1,100 pg/mL    No results found.     All questions at time of visit were answered - patient instructed to contact office with any additional concerns or updates. ER/RTC precautions were reviewed with the patient as applicable.   Please note: manual typing as well as voice recognition software may have been used to produce this document - typos may escape review. Please contact Dr. Sheppard Coil for any needed clarifications.

## 2021-05-10 NOTE — Patient Instructions (Addendum)
General Preventive Care Most recent routine screening labs: see attached!  Blood pressure goal 130/80 or less.  Tobacco: don't!  Alcohol: responsible moderation is ok for most adults - if you have concerns about your alcohol intake, please talk to me!  Exercise: as tolerated to reduce risk of cardiovascular disease and diabetes. Strength training will also prevent osteoporosis.  Mental health: if need for mental health care (medicines, counseling, other), or concerns about moods, please let me know!  Sexual / Reproductive health: if need for STD testing, or if concerns with libido/pain problems, please let me know!  Advanced Directive: Living Will and/or Healthcare Power of Attorney recommended for all adults, regardless of age or health.  Vaccines Flu vaccine: recommended every fall.  Shingles vaccine: all done!  Pneumonia vaccines: all done! Tetanus booster: every 10 years due 04/2027 COVID vaccine: THANKS for getting your vaccine! :) BOOSTER(S) STRONGLY RECOMMENDED  Cancer screenings  Colon cancer screening: Cologuard stool test due 07/2022 Prostate cancer screening: PSA blood test age 21+ Lung cancer screening: not needed since quit >15 years ago Infection screenings  HIV: recommended screening at least once age 6-65 Gonorrhea/Chlamydia: screening as needed Hepatitis C: recommended once for everyone age 6-75 TB: certain at-risk populations, or depending on work requirements and/or travel history Other Bone Density Test: recommended for men at age 10 Abdominal Aortic Aneurysm: screening with ultrasound recommended once for men age 53-75 who have ever smoked - DONE!         FYI to my patients: After six years here, I will be leaving practice at [facility]. My last day here will be 07/01/2021. I will continue to provide your care up until that date, and you will still be considered a patient here after that as long as you want to be!    You will  get a letter in the mail explaining details, but after 07/01/2021, my patients have several options to continue care:  1) you can establish care with Dr. Everrett Coombe or Christen Butter NP, who are accepting new patients here and are absorbing many folks from my current patient panel, OR...  2) you can see any available provider here on as-needed basis until my official replacement starts (hiring a new doctor has not been finalized yet), OR.Marland KitchenMarland Kitchen 3) if you choose to seek care elsewhere, this office will be happy to facilitate transfer of records, and will refill medications on a case-by-case basis.   It is bittersweet to leave! 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 I have truly enjoyed taking care of folks here, but I am also excited for my next adventure doing something a bit different. Take care, and please let us know if you have any questions!   -Dr. Mervyn Skeeters.

## 2021-06-15 ENCOUNTER — Telehealth: Payer: Self-pay

## 2021-06-15 NOTE — Telephone Encounter (Signed)
Patient left a vm msg stating that he tested positive for Covid 2 weeks ago. He re-tested himself on Tuesday and the results were positive. Is there anything that the patient can do to speed up the process of having Covid? Please advise, thanks.

## 2021-10-04 ENCOUNTER — Ambulatory Visit: Payer: Medicare HMO | Admitting: Family Medicine

## 2021-12-05 ENCOUNTER — Other Ambulatory Visit: Payer: Self-pay

## 2021-12-05 DIAGNOSIS — N183 Chronic kidney disease, stage 3 unspecified: Secondary | ICD-10-CM

## 2021-12-05 DIAGNOSIS — I10 Essential (primary) hypertension: Secondary | ICD-10-CM

## 2021-12-05 DIAGNOSIS — Z794 Long term (current) use of insulin: Secondary | ICD-10-CM

## 2021-12-05 DIAGNOSIS — E785 Hyperlipidemia, unspecified: Secondary | ICD-10-CM

## 2021-12-05 DIAGNOSIS — E119 Type 2 diabetes mellitus without complications: Secondary | ICD-10-CM

## 2021-12-07 DIAGNOSIS — E785 Hyperlipidemia, unspecified: Secondary | ICD-10-CM | POA: Diagnosis not present

## 2021-12-07 DIAGNOSIS — I10 Essential (primary) hypertension: Secondary | ICD-10-CM | POA: Diagnosis not present

## 2021-12-07 DIAGNOSIS — Z794 Long term (current) use of insulin: Secondary | ICD-10-CM | POA: Diagnosis not present

## 2021-12-07 DIAGNOSIS — N183 Chronic kidney disease, stage 3 unspecified: Secondary | ICD-10-CM | POA: Diagnosis not present

## 2021-12-07 DIAGNOSIS — E119 Type 2 diabetes mellitus without complications: Secondary | ICD-10-CM | POA: Diagnosis not present

## 2021-12-08 ENCOUNTER — Other Ambulatory Visit: Payer: Self-pay

## 2021-12-08 ENCOUNTER — Encounter: Payer: Self-pay | Admitting: Family Medicine

## 2021-12-08 ENCOUNTER — Ambulatory Visit (INDEPENDENT_AMBULATORY_CARE_PROVIDER_SITE_OTHER): Payer: Medicare HMO | Admitting: Family Medicine

## 2021-12-08 DIAGNOSIS — N183 Chronic kidney disease, stage 3 unspecified: Secondary | ICD-10-CM | POA: Diagnosis not present

## 2021-12-08 DIAGNOSIS — E785 Hyperlipidemia, unspecified: Secondary | ICD-10-CM | POA: Diagnosis not present

## 2021-12-08 DIAGNOSIS — Z794 Long term (current) use of insulin: Secondary | ICD-10-CM

## 2021-12-08 DIAGNOSIS — E119 Type 2 diabetes mellitus without complications: Secondary | ICD-10-CM | POA: Diagnosis not present

## 2021-12-08 DIAGNOSIS — I10 Essential (primary) hypertension: Secondary | ICD-10-CM | POA: Diagnosis not present

## 2021-12-08 DIAGNOSIS — E113293 Type 2 diabetes mellitus with mild nonproliferative diabetic retinopathy without macular edema, bilateral: Secondary | ICD-10-CM | POA: Diagnosis not present

## 2021-12-08 LAB — CBC WITH DIFFERENTIAL/PLATELET
Absolute Monocytes: 666 cells/uL (ref 200–950)
Basophils Absolute: 38 cells/uL (ref 0–200)
Basophils Relative: 0.6 %
Eosinophils Absolute: 326 cells/uL (ref 15–500)
Eosinophils Relative: 5.1 %
HCT: 43.6 % (ref 38.5–50.0)
Hemoglobin: 14.9 g/dL (ref 13.2–17.1)
Lymphs Abs: 1414 cells/uL (ref 850–3900)
MCH: 31 pg (ref 27.0–33.0)
MCHC: 34.2 g/dL (ref 32.0–36.0)
MCV: 90.6 fL (ref 80.0–100.0)
MPV: 10.5 fL (ref 7.5–12.5)
Monocytes Relative: 10.4 %
Neutro Abs: 3955 cells/uL (ref 1500–7800)
Neutrophils Relative %: 61.8 %
Platelets: 199 10*3/uL (ref 140–400)
RBC: 4.81 10*6/uL (ref 4.20–5.80)
RDW: 13.4 % (ref 11.0–15.0)
Total Lymphocyte: 22.1 %
WBC: 6.4 10*3/uL (ref 3.8–10.8)

## 2021-12-08 LAB — COMPREHENSIVE METABOLIC PANEL
AG Ratio: 1.4 (calc) (ref 1.0–2.5)
ALT: 22 U/L (ref 9–46)
AST: 21 U/L (ref 10–35)
Albumin: 4.4 g/dL (ref 3.6–5.1)
Alkaline phosphatase (APISO): 73 U/L (ref 35–144)
BUN/Creatinine Ratio: 26 (calc) — ABNORMAL HIGH (ref 6–22)
BUN: 42 mg/dL — ABNORMAL HIGH (ref 7–25)
CO2: 28 mmol/L (ref 20–32)
Calcium: 9.9 mg/dL (ref 8.6–10.3)
Chloride: 101 mmol/L (ref 98–110)
Creat: 1.61 mg/dL — ABNORMAL HIGH (ref 0.70–1.28)
Globulin: 3.1 g/dL (calc) (ref 1.9–3.7)
Glucose, Bld: 103 mg/dL — ABNORMAL HIGH (ref 65–99)
Potassium: 4.3 mmol/L (ref 3.5–5.3)
Sodium: 141 mmol/L (ref 135–146)
Total Bilirubin: 0.9 mg/dL (ref 0.2–1.2)
Total Protein: 7.5 g/dL (ref 6.1–8.1)

## 2021-12-08 LAB — LIPID PANEL
Cholesterol: 109 mg/dL (ref <200)
HDL: 30 mg/dL — ABNORMAL LOW (ref >=40)
LDL Cholesterol (Calc): 59 mg/dL (calc)
Non-HDL Cholesterol (Calc): 79 mg/dL (calc) (ref <130)
Total CHOL/HDL Ratio: 3.6 (calc) (ref <5.0)
Triglycerides: 116 mg/dL (ref <150)

## 2021-12-08 LAB — HEMOGLOBIN A1C
Hgb A1c MFr Bld: 5.8 % of total Hgb — ABNORMAL HIGH (ref <5.7)
Mean Plasma Glucose: 120 mg/dL
eAG (mmol/L): 6.6 mmol/L

## 2021-12-08 MED ORDER — ATORVASTATIN CALCIUM 40 MG PO TABS: 40.0000 mg | ORAL_TABLET | Freq: Every day | ORAL | 3 refills | Status: DC

## 2021-12-08 MED ORDER — ECONAZOLE NITRATE 1 % EX CREA: TOPICAL_CREAM | Freq: Every day | CUTANEOUS | 2 refills | Status: AC

## 2021-12-08 MED ORDER — IRBESARTAN 150 MG PO TABS: ORAL_TABLET | ORAL | 3 refills | Status: DC

## 2021-12-08 MED ORDER — GLYBURIDE-METFORMIN 2.5-500 MG PO TABS: 2.0000 | ORAL_TABLET | Freq: Every day | ORAL | 3 refills | Status: DC

## 2021-12-08 MED ORDER — HYDROCHLOROTHIAZIDE 12.5 MG PO TABS: 12.5000 mg | ORAL_TABLET | Freq: Every day | ORAL | 3 refills | Status: DC

## 2021-12-08 NOTE — Patient Instructions (Signed)
Your calculated eGFR is 46 based on current lab work.  ?See me again in 6 months ?

## 2021-12-08 NOTE — Assessment & Plan Note (Signed)
Continues to tolerate atorvastatin well.  Continue at current strength. ?

## 2021-12-08 NOTE — Assessment & Plan Note (Signed)
Renal function stable based on recent lab work.  We will need to continue to keep an eye on this as he is using metformin. ?

## 2021-12-08 NOTE — Progress Notes (Signed)
?George Spence - 71 y.o. male MRN [id number]  Date of birth: 02/02/1951 ? ?Subjective ?Chief Complaint  ?Patient presents with  ? New Patient (Initial Visit)  ? ? ?HPI ?George Spence is a 71 year old male here today for follow-up visit.  He is a former patient of Dr. Lyn Hollingshead.  He has history of HTN, T2DM and HLD.   He had labs completed prior to visit today.  We reviewed these in detail and compared it to his previous labs.  Labs are stable compared to previous labs.  A1c has improved significantly.  Continues to do well with glyburide/metformin for management of diabetes.  He has not noted any side effects from medication.  Tolerating atorvastatin for management of associated hyperlipidemia. ? ?Blood pressures remain well controlled with irbesartan hydrochlorothiazide.  He denies any anginal symptoms including chest pain, shortness of breath, palpitations, headaches or vision changes. ? ?ROS:  A comprehensive ROS was completed and negative except as noted per HPI ? ? ? ?No Known Allergies ? ?Past Medical History:  ?Diagnosis Date  ? Diabetes (HCC)   ? Hyperlipidemia   ? Hypertension   ? Type 2 diabetes mellitus without complication (HCC) 08/12/2014  ? ? ?History reviewed. No pertinent surgical history. ? ?Social History  ? ?Socioeconomic History  ? Marital status: Single  ?  Spouse name: Not on file  ? Number of children: Not on file  ? Years of education: Not on file  ? Highest education level: Not on file  ?Occupational History  ? Not on file  ?Tobacco Use  ? Smoking status: Former  ?  Types: Cigarettes  ?  Quit date: 08/13/1979  ?  Years since quitting: 42.3  ? Smokeless tobacco: Never  ?Substance and Sexual Activity  ? Alcohol use: Yes  ?  Alcohol/week: 2.0 standard drinks  ?  Types: 2 Standard drinks or equivalent per week  ? Drug use: No  ? Sexual activity: Yes  ?  Partners: Female  ?Other Topics Concern  ? Not on file  ?Social History Narrative  ? Not on file  ? ?Social Determinants of Health  ? ?Financial Resource  Strain: Not on file  ?Food Insecurity: Not on file  ?Transportation Needs: Not on file  ?Physical Activity: Not on file  ?Stress: Not on file  ?Social Connections: Not on file  ? ? ?History reviewed. No pertinent family history. ? ?Health Maintenance  ?Topic Date Due  ? Hepatitis C Screening  Never done  ? FOOT EXAM  04/18/2018  ? OPHTHALMOLOGY EXAM  02/09/2021  ? COVID-19 Vaccine (2 - Moderna series) 07/09/2021  ? HEMOGLOBIN A1C  06/09/2022  ? Fecal DNA (Cologuard)  07/21/2022  ? TETANUS/TDAP  04/19/2027  ? Pneumonia Vaccine 10+ Years old  Completed  ? INFLUENZA VACCINE  Completed  ? Zoster Vaccines- Shingrix  Completed  ? HPV VACCINES  Aged Out  ? COLONOSCOPY (Pts 45-21yrs Insurance coverage will need to be confirmed)  Discontinued  ? ? ? ?----------------------------------------------------------------------------------------------------------------------------------------------------------------------------------------------------------------- ?Physical Exam ?BP (!) 142/84   Pulse 80   Temp 97.8 ?F (36.6 ?C)   Ht 6' (1.829 m)   Wt 279 lb (126.6 kg)   SpO2 96%   BMI 37.84 kg/m?  ? ?Physical Exam ?Constitutional:   ?   Appearance: Normal appearance.  ?Eyes:  ?   General: No scleral icterus. ?Cardiovascular:  ?   Rate and Rhythm: Normal rate and regular rhythm.  ?Pulmonary:  ?   Effort: Pulmonary effort is normal.  ?   Breath  sounds: Normal breath sounds.  ?Musculoskeletal:  ?   Cervical back: Neck supple.  ?Neurological:  ?   Mental Status: He is alert.  ?Psychiatric:     ?   Mood and Affect: Mood normal.     ?   Behavior: Behavior normal.  ? ? ?------------------------------------------------------------------------------------------------------------------------------------------------------------------------------------------------------------------- ?Assessment and Plan ? ?Essential hypertension ?Blood pressure mains well controlled with current medications.  Recommend continuation.  Medications  renewed. ? 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  Continue current medications for management of glucose.  Continue to work on dietary change. ? ?CKD (chronic kidney disease) stage 3, GFR 30-59 ml/min (HCC) ?Renal function stable based on recent lab work.  We will need to continue to keep an eye on this as he is using metformin. ? ?Hyperlipidemia ?Continues to tolerate atorvastatin well.  Continue at current strength. ? ? ?Meds ordered this encounter  ?Medications  ? econazole nitrate 1 % cream  ?  Sig: Apply topically daily. To nail fungal infection  ?  Dispense:  85 g  ?  Refill:  2  ? glyBURIDE-metformin (GLUCOVANCE) 2.5-500 MG tablet  ?  Sig: Take 2 tablets by mouth daily with breakfast.  ?  Dispense:  360 tablet  ?  Refill:  3  ? irbesartan (AVAPRO) 150 MG tablet  ?  Sig: TAKE 1 TABLET DAILY.  ?  Dispense:  90 tablet  ?  Refill:  3  ? atorvastatin (LIPITOR) 40 MG tablet  ?  Sig: Take 1 tablet (40 mg total) by mouth daily.  ?  Dispense:  90 tablet  ?  Refill:  3  ? hydrochlorothiazide (HYDRODIURIL) 12.5 MG tablet  ?  Sig: Take 1 tablet (12.5 mg total) by mouth daily.  ?  Dispense:  90 tablet  ?  Refill:  3  ? ? ?Return in about 6 months (around 06/10/2022) for Annual exam. ? ? ? ?This visit occurred during the SARS-CoV-2 public health emergency.  Safety protocols were in place, including screening questions prior to the visit, additional usage of staff PPE, and extensive cleaning of exam room while observing appropriate contact time as indicated for disinfecting solutions.  ? ?

## 2021-12-08 NOTE — Assessment & Plan Note (Signed)
Diabetes is much better controlled at this time.  Continue current medications for management of glucose.  Continue to work on dietary change. ?

## 2021-12-08 NOTE — Assessment & Plan Note (Signed)
Blood pressure mains well controlled with current medications.  Recommend continuation.  Medications renewed. ?

## 2022-04-06 DIAGNOSIS — L219 Seborrheic dermatitis, unspecified: Secondary | ICD-10-CM | POA: Diagnosis not present

## 2022-04-06 DIAGNOSIS — L570 Actinic keratosis: Secondary | ICD-10-CM | POA: Diagnosis not present

## 2022-06-06 ENCOUNTER — Encounter: Payer: Self-pay | Admitting: Sports Medicine

## 2022-06-06 ENCOUNTER — Ambulatory Visit (INDEPENDENT_AMBULATORY_CARE_PROVIDER_SITE_OTHER): Payer: Medicare HMO | Admitting: Sports Medicine

## 2022-06-06 ENCOUNTER — Ambulatory Visit: Payer: Medicare HMO

## 2022-06-06 DIAGNOSIS — N5089 Other specified disorders of the male genital organs: Secondary | ICD-10-CM

## 2022-06-06 NOTE — Assessment & Plan Note (Addendum)
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  He had a vasectomy back in the 1970s. Unfortunately he has recently noted a mass left testicle inferior aspect, well-defined, round and movable without any history of trauma, no tenderness. 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 We will go ahead and get a scrotal ultrasound, I have suggested supportive underwear, he can return to see me as needed for this.  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  I will place an order for the follow-up ultrasound, patient should call imaging to get this scheduled sometime in December.  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

## 2022-06-06 NOTE — Progress Notes (Addendum)
    Procedures performed today:    None.  Independent interpretation of notes and tests performed by another provider:   None.  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  He had a vasectomy back in the 1970s. Unfortunately he has recently noted a mass left testicle inferior aspect, well-defined, round and movable without any history of trauma, no tenderness. 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 We will go ahead and get a scrotal ultrasound, I have suggested supportive underwear, he can return to see me as needed for this.  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  I will place an order for the follow-up ultrasound, patient should call imaging to get this scheduled sometime in December.  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    ____________________________________________ George Spence. Benjamin Stain, M.D., ABFM., CAQSM., AME. Primary Care and Sports Medicine [facility]  Adjunct Professor of Family Medicine  [employer]  Restaurant manager, fast food

## 2022-06-07 ENCOUNTER — Ambulatory Visit (INDEPENDENT_AMBULATORY_CARE_PROVIDER_SITE_OTHER): Payer: Medicare HMO

## 2022-06-07 DIAGNOSIS — N5089 Other specified disorders of the male genital organs: Secondary | ICD-10-CM

## 2022-06-07 DIAGNOSIS — I861 Scrotal varices: Secondary | ICD-10-CM | POA: Diagnosis not present

## 2022-06-09 NOTE — Addendum Note (Signed)
Addended by: Monica Becton on: 06/09/2022 09:13 AM   Modules accepted: Orders

## 2022-07-06 NOTE — Progress Notes (Addendum)
[facility] Quality Team Note  Name: George Spence Date of Birth: 11/08/1950 MRN: [id number] Date: 07/06/2022  [facility] Quality Team has reviewed this patient's chart, please see recommendations below:  Diabetic Retinal Eye Exam; Patient requests [facility] Quality Coordinator to schedule Diabetic Retinal Screening at [facility] PATIENT FOLLOWING 08/03/2022 230 PM APPT WITH PCP). [facility] Quality Other; (PATIENT DECLINED TO HAVE COLOGUARD SENT. PATIENT STATES HE HAD ONE 2 YEARS AGO AND DOESN'T NEED ANOTHER.)

## 2022-07-13 ENCOUNTER — Encounter: Payer: Medicare HMO | Admitting: Family Medicine

## 2022-07-31 ENCOUNTER — Telehealth: Payer: Self-pay

## 2022-07-31 NOTE — Telephone Encounter (Signed)
Pt lvm requesting lab orders. Pt had a full panel in March 2023.

## 2022-08-01 ENCOUNTER — Other Ambulatory Visit: Payer: Self-pay

## 2022-08-01 DIAGNOSIS — E119 Type 2 diabetes mellitus without complications: Secondary | ICD-10-CM

## 2022-08-01 DIAGNOSIS — E785 Hyperlipidemia, unspecified: Secondary | ICD-10-CM | POA: Diagnosis not present

## 2022-08-01 DIAGNOSIS — N183 Chronic kidney disease, stage 3 unspecified: Secondary | ICD-10-CM

## 2022-08-01 DIAGNOSIS — Z794 Long term (current) use of insulin: Secondary | ICD-10-CM | POA: Diagnosis not present

## 2022-08-01 NOTE — Addendum Note (Signed)
Addended by: Leontine Locket Z on: 08/01/2022 11:05 AM   Modules accepted: Orders

## 2022-08-02 LAB — CBC WITH DIFFERENTIAL/PLATELET
Absolute Monocytes: 641 cells/uL (ref 200–950)
Basophils Absolute: 49 cells/uL (ref 0–200)
Basophils Relative: 0.8 %
Eosinophils Absolute: 262 cells/uL (ref 15–500)
Eosinophils Relative: 4.3 %
HCT: 44.7 % (ref 38.5–50.0)
Hemoglobin: 15 g/dL (ref 13.2–17.1)
Lymphs Abs: 1312 cells/uL (ref 850–3900)
MCH: 30.4 pg (ref 27.0–33.0)
MCHC: 33.6 g/dL (ref 32.0–36.0)
MCV: 90.7 fL (ref 80.0–100.0)
MPV: 10.2 fL (ref 7.5–12.5)
Monocytes Relative: 10.5 %
Neutro Abs: 3837 cells/uL (ref 1500–7800)
Neutrophils Relative %: 62.9 %
Platelets: 239 10*3/uL (ref 140–400)
RBC: 4.93 10*6/uL (ref 4.20–5.80)
RDW: 12.8 % (ref 11.0–15.0)
Total Lymphocyte: 21.5 %
WBC: 6.1 10*3/uL (ref 3.8–10.8)

## 2022-08-02 LAB — COMPREHENSIVE METABOLIC PANEL
AG Ratio: 1.3 (calc) (ref 1.0–2.5)
ALT: 23 U/L (ref 9–46)
AST: 19 U/L (ref 10–35)
Albumin: 4.2 g/dL (ref 3.6–5.1)
Alkaline phosphatase (APISO): 69 U/L (ref 35–144)
BUN/Creatinine Ratio: 32 (calc) — ABNORMAL HIGH (ref 6–22)
BUN: 47 mg/dL — ABNORMAL HIGH (ref 7–25)
CO2: 26 mmol/L (ref 20–32)
Calcium: 9.3 mg/dL (ref 8.6–10.3)
Chloride: 102 mmol/L (ref 98–110)
Creat: 1.45 mg/dL — ABNORMAL HIGH (ref 0.70–1.28)
Globulin: 3.3 g/dL (calc) (ref 1.9–3.7)
Glucose, Bld: 139 mg/dL — ABNORMAL HIGH (ref 65–99)
Potassium: 4.6 mmol/L (ref 3.5–5.3)
Sodium: 139 mmol/L (ref 135–146)
Total Bilirubin: 0.9 mg/dL (ref 0.2–1.2)
Total Protein: 7.5 g/dL (ref 6.1–8.1)

## 2022-08-02 LAB — LIPID PANEL
Cholesterol: 120 mg/dL (ref <200)
HDL: 32 mg/dL — ABNORMAL LOW (ref >=40)
LDL Cholesterol (Calc): 67 mg/dL (calc)
Non-HDL Cholesterol (Calc): 88 mg/dL (calc) (ref <130)
Total CHOL/HDL Ratio: 3.8 (calc) (ref <5.0)
Triglycerides: 127 mg/dL (ref <150)

## 2022-08-02 LAB — HEMOGLOBIN A1C
Hgb A1c MFr Bld: 6.5 % of total Hgb — ABNORMAL HIGH (ref <5.7)
Mean Plasma Glucose: 140 mg/dL
eAG (mmol/L): 7.7 mmol/L

## 2022-08-03 ENCOUNTER — Ambulatory Visit (INDEPENDENT_AMBULATORY_CARE_PROVIDER_SITE_OTHER): Payer: Medicare HMO | Admitting: Family Medicine

## 2022-08-03 ENCOUNTER — Encounter: Payer: Self-pay | Admitting: Family Medicine

## 2022-08-03 VITALS — BP 144/80 | HR 70 | Ht 72.0 in | Wt 280.0 lb

## 2022-08-03 DIAGNOSIS — D510 Vitamin B12 deficiency anemia due to intrinsic factor deficiency: Secondary | ICD-10-CM

## 2022-08-03 DIAGNOSIS — Z125 Encounter for screening for malignant neoplasm of prostate: Secondary | ICD-10-CM

## 2022-08-03 DIAGNOSIS — I10 Essential (primary) hypertension: Secondary | ICD-10-CM | POA: Diagnosis not present

## 2022-08-03 DIAGNOSIS — Z0000 Encounter for general adult medical examination without abnormal findings: Secondary | ICD-10-CM | POA: Diagnosis not present

## 2022-08-03 DIAGNOSIS — E113293 Type 2 diabetes mellitus with mild nonproliferative diabetic retinopathy without macular edema, bilateral: Secondary | ICD-10-CM | POA: Diagnosis not present

## 2022-08-03 LAB — HM DIABETES EYE EXAM

## 2022-08-03 MED ORDER — IRBESARTAN 150 MG PO TABS: ORAL_TABLET | ORAL | 3 refills | Status: DC

## 2022-08-03 NOTE — Progress Notes (Signed)
George Spence - 71 y.o. male MRN [id number]  Date of birth: 05-May-1951  Subjective Chief Complaint  Patient presents with   Annual Exam    HPI George Spence is a 71 year old male here today for annual exam.  He reports he feels like he is doing pretty well.  He had his labs completed prior to visit.  We reviewed these in detail.  His A1c had improved quite a bit but is up some to 6.5%.  Renal function is stable.  He does continue on glyburide with metformin.  Tolerating well without side effects.  He does admit he could be a little more active.  He is starting to work on some dietary change.  He has remote history of smoking for short period of time.  He does consume alcohol occasionally during the week.  He reports he has already had COVID and flu vaccine.  Review of Systems  Constitutional:  Negative for chills, fever, malaise/fatigue and weight loss.  HENT:  Negative for congestion, ear pain and sore throat.   Eyes:  Negative for blurred vision, double vision and pain.  Respiratory:  Negative for cough and shortness of breath.   Cardiovascular:  Negative for chest pain and palpitations.  Gastrointestinal:  Negative for abdominal pain, blood in stool, constipation, heartburn and nausea.  Genitourinary:  Negative for dysuria and urgency.  Musculoskeletal:  Negative for joint pain and myalgias.  Neurological:  Negative for dizziness and headaches.  Endo/Heme/Allergies:  Does not bruise/bleed easily.  Psychiatric/Behavioral:  Negative for depression. The patient is not nervous/anxious and does not have insomnia.     No Known Allergies  Past Medical History:  Diagnosis Date   Diabetes ([place])    Hyperlipidemia    Hypertension    Type 2 diabetes mellitus without complication ([place]) 25/36/6440    No past surgical history on file.  Social History   Socioeconomic History   Marital status: Single    Spouse name: Not on file   Number of children: Not on file   Years of education: Not  on file   Highest education level: Not on file  Occupational History   Not on file  Tobacco Use   Smoking status: Former    Types: Cigarettes    Quit date: 08/13/1979    Years since quitting: 43.0   Smokeless tobacco: Never  Substance and Sexual Activity   Alcohol use: Yes    Alcohol/week: 2.0 standard drinks of alcohol    Types: 2 Standard drinks or equivalent per week   Drug use: No   Sexual activity: Yes    Partners: Female  Other Topics Concern   Not on file  Social History Narrative   Not on file   Social Determinants of Health   Financial Resource Strain: Not on file  Food Insecurity: Not on file  Transportation Needs: Not on file  Physical Activity: Not on file  Stress: Not on file  Social Connections: Not on file    No family history on file.  Health Maintenance  Topic Date Due   Medicare Annual Wellness (AWV)  Never done   Hepatitis C Screening  Never done   Diabetic kidney evaluation - Urine ACR  08/13/2015   COVID-19 Vaccine (3 - Moderna series) 08/19/2022 (Originally 11/09/2021)   Fecal DNA (Cologuard)  08/04/2023 (Originally 07/21/2022)   HEMOGLOBIN A1C  01/30/2023   Diabetic kidney evaluation - GFR measurement  08/02/2023   FOOT EXAM  08/02/2023   OPHTHALMOLOGY EXAM  08/04/2023  TETANUS/TDAP  04/19/2027   Pneumonia Vaccine 83+ Years old  Completed   INFLUENZA VACCINE  Completed   Zoster Vaccines- Shingrix  Completed   HPV VACCINES  Aged Out   COLONOSCOPY (Pts 45-41yrs Insurance coverage will need to be confirmed)  Discontinued     ----------------------------------------------------------------------------------------------------------------------------------------------------------------------------------------------------------------- Physical Exam BP (!) 144/80   Pulse 70   Ht 6' (1.829 m)   Wt 280 lb (127 kg)   SpO2 99%   BMI 37.97 kg/m   Physical Exam Constitutional:      General: He is not in acute distress. HENT:     Head:  Normocephalic and atraumatic.     Right Ear: Tympanic membrane and external ear normal.     Left Ear: Tympanic membrane and external ear normal.  Eyes:     General: No scleral icterus. Neck:     Thyroid: No thyromegaly.  Cardiovascular:     Rate and Rhythm: Normal rate and regular rhythm.     Heart sounds: Normal heart sounds.  Pulmonary:     Effort: Pulmonary effort is normal.     Breath sounds: Normal breath sounds.  Abdominal:     General: Bowel sounds are normal. There is no distension.     Palpations: Abdomen is soft.     Tenderness: There is no abdominal tenderness. There is no guarding.  Musculoskeletal:     Cervical back: Normal range of motion.  Lymphadenopathy:     Cervical: No cervical adenopathy.  Skin:    General: Skin is warm and dry.     Findings: No rash.  Neurological:     Mental Status: He is alert and oriented to person, place, and time.     Cranial Nerves: No cranial nerve deficit.     Motor: No abnormal muscle tone.  Psychiatric:        Mood and Affect: Mood normal.        Behavior: Behavior normal.     ------------------------------------------------------------------------------------------------------------------------------------------------------------------------------------------------------------------- Assessment and Plan  Well adult exam Well adult Orders Placed This Encounter  Procedures   COMPLETE METABOLIC PANEL WITH GFR   CBC with Differential   PSA   HgB A1c   Urine Microalbumin w/creat. ratio   B12   Lipid Panel w/reflex Direct LDL  Screenings: Per lab orders Immunizations: Up-to-date Anticipatory guidance/risk factor reduction: Recommendations per AVS.   Meds ordered this encounter  Medications   irbesartan (AVAPRO) 150 MG tablet    Sig: TAKE 1 TABLET DAILY.    Dispense:  90 tablet    Refill:  3    Return in about 6 months (around 02/01/2023) for T2DM/HTN.    This visit occurred during the SARS-CoV-2 public health  emergency.  Safety protocols were in place, including screening questions prior to the visit, additional usage of staff PPE, and extensive cleaning of exam room while observing appropriate contact time as indicated for disinfecting solutions.

## 2022-08-03 NOTE — Assessment & Plan Note (Signed)
Well adult Orders Placed This Encounter  Procedures   COMPLETE METABOLIC PANEL WITH GFR   CBC with Differential   PSA   HgB A1c   Urine Microalbumin w/creat. ratio   B12   Lipid Panel w/reflex Direct LDL  Screenings: Per lab orders Immunizations: Up-to-date Anticipatory guidance/risk factor reduction: Recommendations per AVS.

## 2022-08-03 NOTE — Patient Instructions (Signed)
Preventive Care 65 Years and Older, Male Preventive care refers to lifestyle choices and visits with your health care provider that can promote health and wellness. Preventive care visits are also called wellness exams. What can I expect for my preventive care visit? Counseling During your preventive care visit, your health care provider may ask about your: Medical history, including: Past medical problems. Family medical history. History of falls. Current health, including: Emotional well-being. Home life and relationship well-being. Sexual activity. Memory and ability to understand (cognition). Lifestyle, including: Alcohol, nicotine or tobacco, and drug use. Access to firearms. Diet, exercise, and sleep habits. Work and work environment. Sunscreen use. Safety issues such as seatbelt and bike helmet use. Physical exam Your health care provider will check your: Height and weight. These may be used to calculate your BMI (body mass index). BMI is a measurement that tells if you are at a healthy weight. Waist circumference. This measures the distance around your waistline. This measurement also tells if you are at a healthy weight and may help predict your risk of certain diseases, such as type 2 diabetes and high blood pressure. Heart rate and blood pressure. Body temperature. Skin for abnormal spots. What immunizations do I need?  Vaccines are usually given at various ages, according to a schedule. Your health care provider will recommend vaccines for you based on your age, medical history, and lifestyle or other factors, such as travel or where you work. What tests do I need? Screening Your health care provider may recommend screening tests for certain conditions. This may include: Lipid and cholesterol levels. Diabetes screening. This is done by checking your blood sugar (glucose) after you have not eaten for a while (fasting). Hepatitis C test. Hepatitis B test. HIV (human  immunodeficiency virus) test. STI (sexually transmitted infection) testing, if you are at risk. Lung cancer screening. Colorectal cancer screening. Prostate cancer screening. Abdominal aortic aneurysm (AAA) screening. You may need this if you are a current or former smoker. Talk with your health care provider about your test results, treatment options, and if necessary, the need for more tests. Follow these instructions at home: Eating and drinking  Eat a diet that includes fresh fruits and vegetables, whole grains, lean protein, and low-fat dairy products. Limit your intake of foods with high amounts of sugar, saturated fats, and salt. Take vitamin and mineral supplements as recommended by your health care provider. Do not drink alcohol if your health care provider tells you not to drink. If you drink alcohol: Limit how much you have to 0-2 drinks a day. Know how much alcohol is in your drink. In the [place] one drink equals one 12 oz bottle of beer (355 mL), one 5 oz glass of wine (148 mL), or one 1 oz glass of hard liquor (44 mL). Lifestyle Brush your teeth every morning and night with fluoride toothpaste. Floss one time each day. Exercise for at least 30 minutes 5 or more days each week. Do not use any products that contain nicotine or tobacco. These products include cigarettes, chewing tobacco, and vaping devices, such as e-cigarettes. If you need help quitting, ask your health care provider. Do not use drugs. If you are sexually active, practice safe sex. Use a condom or other form of protection to prevent STIs. Take aspirin only as told by your health care provider. Make sure that you understand how much to take and what form to take. Work with your health care provider to find out whether it is safe   and beneficial for you to take aspirin daily. Ask your health care provider if you need to take a cholesterol-lowering medicine (statin). Find healthy ways to manage stress, such  as: Meditation, yoga, or listening to music. Journaling. Talking to a trusted person. Spending time with friends and family. Safety Always wear your seat belt while driving or riding in a vehicle. Do not drive: If you have been drinking alcohol. Do not ride with someone who has been drinking. When you are tired or distracted. While texting. If you have been using any mind-altering substances or drugs. Wear a helmet and other protective equipment during sports activities. If you have firearms in your house, make sure you follow all gun safety procedures. Minimize exposure to UV radiation to reduce your risk of skin cancer. What's next? Visit your health care provider once a year for an annual wellness visit. Ask your health care provider how often you should have your eyes and teeth checked. Stay up to date on all vaccines. This information is not intended to replace advice given to you by your health care provider. Make sure you discuss any questions you have with your health care provider. Document Revised: 03/16/2021 Document Reviewed: 03/16/2021 Elsevier Patient Education  2023 Elsevier Inc.  

## 2022-08-17 ENCOUNTER — Telehealth: Payer: Self-pay

## 2022-08-17 NOTE — Telephone Encounter (Signed)
Pt lvm concerning irbesartan refills and eye exam results.  Returned patient's call. LVM advising pt: 1. Medication refill was sent on 08/03/22 to mail order pharmacy. Offered short term refill to local pharmacy, if needed. 2. Follow-up on Eye Exam results. Advised pt when exam results are received from the [facility] we will contact him.

## 2022-09-07 ENCOUNTER — Encounter: Payer: Self-pay | Admitting: Family Medicine

## 2022-12-22 ENCOUNTER — Other Ambulatory Visit: Payer: Self-pay | Admitting: Family Medicine

## 2022-12-22 DIAGNOSIS — I10 Essential (primary) hypertension: Secondary | ICD-10-CM

## 2022-12-22 DIAGNOSIS — E119 Type 2 diabetes mellitus without complications: Secondary | ICD-10-CM

## 2022-12-22 DIAGNOSIS — E785 Hyperlipidemia, unspecified: Secondary | ICD-10-CM

## 2022-12-25 NOTE — Telephone Encounter (Signed)
Yes, ok to fill

## 2023-01-18 ENCOUNTER — Telehealth: Payer: Self-pay | Admitting: Family Medicine

## 2023-01-18 NOTE — Telephone Encounter (Signed)
Called patient to schedule Medicare Annual Wellness Visit (AWV). Left message for patient to call back and schedule Medicare Annual Wellness Visit (AWV).  Last date of AWV: Never  Please schedule an appointment at any time with [facility].  If any questions, please contact me at [phone].  Thank you ,  Morgan Jessup Patient Access Advocate II Direct Dial: [phone]  

## 2023-01-18 NOTE — Telephone Encounter (Signed)
Patient called to say he declined the Medicare Wellness Visit at this time

## 2023-01-31 DIAGNOSIS — Z0100 Encounter for examination of eyes and vision without abnormal findings: Secondary | ICD-10-CM | POA: Diagnosis not present

## 2023-01-31 DIAGNOSIS — H2513 Age-related nuclear cataract, bilateral: Secondary | ICD-10-CM | POA: Diagnosis not present

## 2023-01-31 DIAGNOSIS — E119 Type 2 diabetes mellitus without complications: Secondary | ICD-10-CM | POA: Diagnosis not present

## 2023-01-31 DIAGNOSIS — H524 Presbyopia: Secondary | ICD-10-CM | POA: Diagnosis not present

## 2023-03-07 ENCOUNTER — Ambulatory Visit: Payer: Medicare HMO | Admitting: Family Medicine

## 2023-03-12 DIAGNOSIS — M25511 Pain in right shoulder: Secondary | ICD-10-CM | POA: Diagnosis not present

## 2023-03-15 ENCOUNTER — Other Ambulatory Visit: Payer: Self-pay | Admitting: Family Medicine

## 2023-03-15 DIAGNOSIS — E119 Type 2 diabetes mellitus without complications: Secondary | ICD-10-CM

## 2023-03-15 DIAGNOSIS — I10 Essential (primary) hypertension: Secondary | ICD-10-CM

## 2023-03-15 DIAGNOSIS — E785 Hyperlipidemia, unspecified: Secondary | ICD-10-CM

## 2023-04-02 ENCOUNTER — Telehealth: Payer: Self-pay | Admitting: Family Medicine

## 2023-04-02 NOTE — Telephone Encounter (Signed)
Patient is requesting lab orders to be done prior to appointment for November 4th 2024

## 2023-04-03 ENCOUNTER — Other Ambulatory Visit: Payer: Self-pay | Admitting: Family Medicine

## 2023-04-03 DIAGNOSIS — Z125 Encounter for screening for malignant neoplasm of prostate: Secondary | ICD-10-CM

## 2023-04-03 DIAGNOSIS — Z0000 Encounter for general adult medical examination without abnormal findings: Secondary | ICD-10-CM

## 2023-04-04 ENCOUNTER — Ambulatory Visit: Payer: Medicare HMO | Admitting: Family Medicine

## 2023-04-07 ENCOUNTER — Other Ambulatory Visit: Payer: Self-pay | Admitting: Family Medicine

## 2023-04-07 DIAGNOSIS — E119 Type 2 diabetes mellitus without complications: Secondary | ICD-10-CM

## 2023-04-07 DIAGNOSIS — I10 Essential (primary) hypertension: Secondary | ICD-10-CM

## 2023-04-07 DIAGNOSIS — Z794 Long term (current) use of insulin: Secondary | ICD-10-CM

## 2023-04-07 DIAGNOSIS — E785 Hyperlipidemia, unspecified: Secondary | ICD-10-CM

## 2023-04-19 ENCOUNTER — Other Ambulatory Visit: Payer: Self-pay | Admitting: Family Medicine

## 2023-04-19 DIAGNOSIS — E785 Hyperlipidemia, unspecified: Secondary | ICD-10-CM

## 2023-04-19 DIAGNOSIS — E119 Type 2 diabetes mellitus without complications: Secondary | ICD-10-CM

## 2023-04-19 DIAGNOSIS — I10 Essential (primary) hypertension: Secondary | ICD-10-CM

## 2023-04-20 ENCOUNTER — Other Ambulatory Visit: Payer: Self-pay

## 2023-06-27 NOTE — Progress Notes (Signed)
[facility] Quality Team Note  Name: George Spence Date of Birth: September 26, 1951 MRN: [id number] Date: 06/27/2023  [facility] Quality Team has reviewed this patient's chart, please see recommendations below:  [facility] Quality Other; (COL AND GSD. PATIENT NEEDS COLON CANCER SCREENING AND A1C VALUE OF 9.0 OR LESS FOR GAP CLOSURE. PLEASE ADDRESS AT UPCOMING OFFICE VISIT WITH PCP 08/06/2023)

## 2023-07-30 ENCOUNTER — Telehealth: Payer: Self-pay | Admitting: Family Medicine

## 2023-07-30 DIAGNOSIS — N183 Chronic kidney disease, stage 3 unspecified: Secondary | ICD-10-CM

## 2023-07-30 DIAGNOSIS — Z125 Encounter for screening for malignant neoplasm of prostate: Secondary | ICD-10-CM

## 2023-07-30 DIAGNOSIS — D7589 Other specified diseases of blood and blood-forming organs: Secondary | ICD-10-CM

## 2023-07-30 DIAGNOSIS — E785 Hyperlipidemia, unspecified: Secondary | ICD-10-CM

## 2023-07-30 DIAGNOSIS — I10 Essential (primary) hypertension: Secondary | ICD-10-CM

## 2023-07-30 DIAGNOSIS — E113293 Type 2 diabetes mellitus with mild nonproliferative diabetic retinopathy without macular edema, bilateral: Secondary | ICD-10-CM

## 2023-07-30 NOTE — Telephone Encounter (Signed)
Patient is requesting blood work before his appointment on 08-09-23

## 2023-08-06 ENCOUNTER — Ambulatory Visit: Payer: Medicare HMO | Admitting: Family Medicine

## 2023-08-07 DIAGNOSIS — I10 Essential (primary) hypertension: Secondary | ICD-10-CM | POA: Diagnosis not present

## 2023-08-07 DIAGNOSIS — E113293 Type 2 diabetes mellitus with mild nonproliferative diabetic retinopathy without macular edema, bilateral: Secondary | ICD-10-CM | POA: Diagnosis not present

## 2023-08-07 DIAGNOSIS — E785 Hyperlipidemia, unspecified: Secondary | ICD-10-CM | POA: Diagnosis not present

## 2023-08-07 DIAGNOSIS — N183 Chronic kidney disease, stage 3 unspecified: Secondary | ICD-10-CM | POA: Diagnosis not present

## 2023-08-07 DIAGNOSIS — D7589 Other specified diseases of blood and blood-forming organs: Secondary | ICD-10-CM | POA: Diagnosis not present

## 2023-08-07 DIAGNOSIS — Z125 Encounter for screening for malignant neoplasm of prostate: Secondary | ICD-10-CM | POA: Diagnosis not present

## 2023-08-08 LAB — CMP14+EGFR
ALT: 23 [IU]/L (ref 0–44)
AST: 23 [IU]/L (ref 0–40)
Albumin: 4.3 g/dL (ref 3.8–4.8)
Alkaline Phosphatase: 79 [IU]/L (ref 44–121)
BUN/Creatinine Ratio: 21 (ref 10–24)
BUN: 30 mg/dL — ABNORMAL HIGH (ref 8–27)
Bilirubin Total: 0.5 mg/dL (ref 0.0–1.2)
CO2: 23 mmol/L (ref 20–29)
Calcium: 9.6 mg/dL (ref 8.6–10.2)
Chloride: 102 mmol/L (ref 96–106)
Creatinine, Ser: 1.41 mg/dL — ABNORMAL HIGH (ref 0.76–1.27)
Globulin, Total: 2.9 g/dL (ref 1.5–4.5)
Glucose: 113 mg/dL — ABNORMAL HIGH (ref 70–99)
Potassium: 4.7 mmol/L (ref 3.5–5.2)
Sodium: 141 mmol/L (ref 134–144)
Total Protein: 7.2 g/dL (ref 6.0–8.5)
eGFR: 53 mL/min/{1.73_m2} — ABNORMAL LOW (ref >59)

## 2023-08-08 LAB — CBC WITH DIFFERENTIAL/PLATELET
Basophils Absolute: 0.1 10*3/uL (ref 0.0–0.2)
Basos: 1 %
EOS (ABSOLUTE): 0.4 10*3/uL (ref 0.0–0.4)
Eos: 5 %
Hematocrit: 44.1 % (ref 37.5–51.0)
Hemoglobin: 14.4 g/dL (ref 13.0–17.7)
Immature Grans (Abs): 0 10*3/uL (ref 0.0–0.1)
Immature Granulocytes: 0 %
Lymphocytes Absolute: 1.4 10*3/uL (ref 0.7–3.1)
Lymphs: 21 %
MCH: 30.6 pg (ref 26.6–33.0)
MCHC: 32.7 g/dL (ref 31.5–35.7)
MCV: 94 fL (ref 79–97)
Monocytes Absolute: 1 10*3/uL — ABNORMAL HIGH (ref 0.1–0.9)
Monocytes: 14 %
Neutrophils Absolute: 3.9 10*3/uL (ref 1.4–7.0)
Neutrophils: 59 %
Platelets: 232 10*3/uL (ref 150–450)
RBC: 4.71 x10E6/uL (ref 4.14–5.80)
RDW: 11.9 % (ref 11.6–15.4)
WBC: 6.7 10*3/uL (ref 3.4–10.8)

## 2023-08-08 LAB — LIPID PANEL WITH LDL/HDL RATIO
Cholesterol, Total: 152 mg/dL (ref 100–199)
HDL: 38 mg/dL — ABNORMAL LOW (ref >39)
LDL Chol Calc (NIH): 89 mg/dL (ref 0–99)
LDL/HDL Ratio: 2.3 ratio (ref 0.0–3.6)
Triglycerides: 140 mg/dL (ref 0–149)
VLDL Cholesterol Cal: 25 mg/dL (ref 5–40)

## 2023-08-08 LAB — PSA: Prostate Specific Ag, Serum: 1.4 ng/mL (ref 0.0–4.0)

## 2023-08-08 LAB — HEMOGLOBIN A1C
Est. average glucose Bld gHb Est-mCnc: 143 mg/dL
Hgb A1c MFr Bld: 6.6 % — ABNORMAL HIGH (ref 4.8–5.6)

## 2023-08-08 LAB — VITAMIN B12: Vitamin B-12: 795 pg/mL (ref 232–1245)

## 2023-08-08 LAB — MICROALBUMIN / CREATININE URINE RATIO
Creatinine, Urine: 87.5 mg/dL
Microalb/Creat Ratio: 95 mg/g{creat} — ABNORMAL HIGH (ref 0–29)
Microalbumin, Urine: 83.1 ug/mL

## 2023-08-09 ENCOUNTER — Ambulatory Visit: Payer: Medicare HMO | Admitting: Family Medicine

## 2023-08-09 ENCOUNTER — Encounter: Payer: Self-pay | Admitting: Family Medicine

## 2023-08-09 VITALS — BP 135/80 | HR 74 | Ht 72.0 in | Wt 274.0 lb

## 2023-08-09 DIAGNOSIS — I10 Essential (primary) hypertension: Secondary | ICD-10-CM | POA: Diagnosis not present

## 2023-08-09 DIAGNOSIS — Z0000 Encounter for general adult medical examination without abnormal findings: Secondary | ICD-10-CM | POA: Diagnosis not present

## 2023-08-09 DIAGNOSIS — E113293 Type 2 diabetes mellitus with mild nonproliferative diabetic retinopathy without macular edema, bilateral: Secondary | ICD-10-CM

## 2023-08-09 DIAGNOSIS — Z1211 Encounter for screening for malignant neoplasm of colon: Secondary | ICD-10-CM

## 2023-08-09 MED ORDER — OZEMPIC (0.25 OR 0.5 MG/DOSE) 2 MG/3ML ~~LOC~~ SOPN: PEN_INJECTOR | SUBCUTANEOUS | 0 refills | Status: DC

## 2023-08-09 NOTE — Progress Notes (Signed)
George Spence - 72 y.o. male MRN [id number]  Date of birth: 1951/01/06  Subjective Chief Complaint  Patient presents with   Annual Exam    HPI George Spence is a 72 y.o. male here today for annual exam.    He reports that he is doing well.  [place] and he has been working on rebuilding this.   BP is elevated today. A1c increased some since last visit.    He had labs completed prior to visit.    He is moderately active.  He feels that diet is pretty good.    Non-smoker.  Occasional EtOH.   Due for updated cologuard.    Review of Systems  Constitutional:  Negative for chills, fever, malaise/fatigue and weight loss.  HENT:  Negative for congestion, ear pain and sore throat.   Eyes:  Negative for blurred vision, double vision and pain.  Respiratory:  Negative for cough and shortness of breath.   Cardiovascular:  Negative for chest pain and palpitations.  Gastrointestinal:  Negative for abdominal pain, blood in stool, constipation, heartburn and nausea.  Genitourinary:  Negative for dysuria and urgency.  Musculoskeletal:  Negative for joint pain and myalgias.  Neurological:  Negative for dizziness and headaches.  Endo/Heme/Allergies:  Does not bruise/bleed easily.  Psychiatric/Behavioral:  Negative for depression. The patient is not nervous/anxious and does not have insomnia.     No Known Allergies  Past Medical History:  Diagnosis Date   Diabetes (HCC)    Hyperlipidemia    Hypertension    Type 2 diabetes mellitus without complication (HCC) 08/12/2014    History reviewed. No pertinent surgical history.  Social History   Socioeconomic History   Marital status: Single    Spouse name: Not on file   Number of children: Not on file   Years of education: Not on file   Highest education level: Not on file  Occupational History   Not on file  Tobacco Use   Smoking status: Former    Current packs/day: 0.00    Types: Cigarettes     Quit date: 08/13/1979    Years since quitting: 44.0   Smokeless tobacco: Never  Substance and Sexual Activity   Alcohol use: Yes    Alcohol/week: 2.0 standard drinks of alcohol    Types: 2 Standard drinks or equivalent per week   Drug use: No   Sexual activity: Yes    Partners: Female  Other Topics Concern   Not on file  Social History Narrative   Not on file   Social Determinants of Health   Financial Resource Strain: Not on file  Food Insecurity: Not on file  Transportation Needs: Not on file  Physical Activity: Not on file  Stress: Not on file  Social Connections: Not on file    History reviewed. No pertinent family history.  Health Maintenance  Topic Date Due   Medicare Annual Wellness (AWV)  Never done   Hepatitis C Screening  Never done   Fecal DNA (Cologuard)  07/21/2022   COVID-19 Vaccine (3 - 2023-24 season) 06/03/2023   FOOT EXAM  08/02/2023   OPHTHALMOLOGY EXAM  08/04/2023   INFLUENZA VACCINE  12/31/2023 (Originally 05/03/2023)   HEMOGLOBIN A1C  02/04/2024   Diabetic kidney evaluation - eGFR measurement  08/06/2024   Diabetic kidney evaluation - Urine ACR  08/06/2024   DTaP/Tdap/Td (2 - Td or Tdap) 04/19/2027   Pneumonia Vaccine 71+ Years old  Completed  Zoster Vaccines- Shingrix  Completed   HPV VACCINES  Aged Out   Colonoscopy  Discontinued     ----------------------------------------------------------------------------------------------------------------------------------------------------------------------------------------------------------------- Physical Exam BP 135/80   Pulse 74   Ht 6' (1.829 m)   Wt 274 lb (124.3 kg)   SpO2 99%   BMI 37.16 kg/m   Physical Exam Constitutional:      General: He is not in acute distress. HENT:     Head: Normocephalic and atraumatic.     Right Ear: Tympanic membrane and external ear normal.     Left Ear: Tympanic membrane and external ear normal.  Eyes:     General: No scleral icterus. Neck:      Thyroid: No thyromegaly.  Cardiovascular:     Rate and Rhythm: Normal rate and regular rhythm.     Heart sounds: Normal heart sounds.  Pulmonary:     Effort: Pulmonary effort is normal.     Breath sounds: Normal breath sounds.  Abdominal:     General: Bowel sounds are normal. There is no distension.     Palpations: Abdomen is soft.     Tenderness: There is no abdominal tenderness. There is no guarding.  Musculoskeletal:     Cervical back: Normal range of motion.  Lymphadenopathy:     Cervical: No cervical adenopathy.  Skin:    General: Skin is warm and dry.     Findings: No rash.  Neurological:     Mental Status: He is alert and oriented to person, place, and time.     Cranial Nerves: No cranial nerve deficit.     Motor: No abnormal muscle tone.  Psychiatric:        Mood and Affect: Mood normal.        Behavior: Behavior normal.     ------------------------------------------------------------------------------------------------------------------------------------------------------------------------------------------------------------------- Assessment and Plan  Well adult exam Well adult Recent labs reviewed with him.  Screenings: Cologuard ordered Immunizations:  Recommend RSV from his pharmacy.   Anticipatory guidance/Risk factor reduction:  Recommendations per AVS.   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  Start Ozempic and titrate over the next few months based on tolerance and response.   Essential hypertension BP is fairly well controlled.  Recommend continuation of current medications.    Meds ordered this encounter  Medications   Semaglutide,0.25 or 0.5MG /DOS, (OZEMPIC, 0.25 OR 0.5 MG/DOSE,) 2 MG/3ML SOPN    Sig: 0.5mg  subcutaneous weekly.    Dispense:  9 mL    Refill:  0    Return in about 6 months (around 02/06/2024) for Type 2 Diabetes.    This visit occurred during the SARS-CoV-2 public health emergency.  Safety protocols  were in place, including screening questions prior to the visit, additional usage of staff PPE, and extensive cleaning of exam room while observing appropriate contact time as indicated for disinfecting solutions.

## 2023-08-09 NOTE — Assessment & Plan Note (Signed)
BP is fairly well controlled.  Recommend continuation of current medications.

## 2023-08-09 NOTE — Assessment & Plan Note (Signed)
Well adult Recent labs reviewed with him.  Screenings: Cologuard ordered Immunizations:  Recommend RSV from his pharmacy.   Anticipatory guidance/Risk factor reduction:  Recommendations per AVS.

## 2023-08-09 NOTE — Assessment & Plan Note (Signed)
Stop glucovance.  Start Ozempic and titrate over the next few months based on tolerance and response.

## 2023-08-09 NOTE — Patient Instructions (Signed)
Preventive Care 65 Years and Older, Male Preventive care refers to lifestyle choices and visits with your health care provider that can promote health and wellness. Preventive care visits are also called wellness exams. What can I expect for my preventive care visit? Counseling During your preventive care visit, your health care provider may ask about your: Medical history, including: Past medical problems. Family medical history. History of falls. Current health, including: Emotional well-being. Home life and relationship well-being. Sexual activity. Memory and ability to understand (cognition). Lifestyle, including: Alcohol, nicotine or tobacco, and drug use. Access to firearms. Diet, exercise, and sleep habits. Work and work environment. Sunscreen use. Safety issues such as seatbelt and bike helmet use. Physical exam Your health care provider will check your: Height and weight. These may be used to calculate your BMI (body mass index). BMI is a measurement that tells if you are at a healthy weight. Waist circumference. This measures the distance around your waistline. This measurement also tells if you are at a healthy weight and may help predict your risk of certain diseases, such as type 2 diabetes and high blood pressure. Heart rate and blood pressure. Body temperature. Skin for abnormal spots. What immunizations do I need?  Vaccines are usually given at various ages, according to a schedule. Your health care provider will recommend vaccines for you based on your age, medical history, and lifestyle or other factors, such as travel or where you work. What tests do I need? Screening Your health care provider may recommend screening tests for certain conditions. This may include: Lipid and cholesterol levels. Diabetes screening. This is done by checking your blood sugar (glucose) after you have not eaten for a while (fasting). Hepatitis C test. Hepatitis B test. HIV (human  immunodeficiency virus) test. STI (sexually transmitted infection) testing, if you are at risk. Lung cancer screening. Colorectal cancer screening. Prostate cancer screening. Abdominal aortic aneurysm (AAA) screening. You may need this if you are a current or former smoker. Talk with your health care provider about your test results, treatment options, and if necessary, the need for more tests. Follow these instructions at home: Eating and drinking  Eat a diet that includes fresh fruits and vegetables, whole grains, lean protein, and low-fat dairy products. Limit your intake of foods with high amounts of sugar, saturated fats, and salt. Take vitamin and mineral supplements as recommended by your health care provider. Do not drink alcohol if your health care provider tells you not to drink. If you drink alcohol: Limit how much you have to 0-2 drinks a day. Know how much alcohol is in your drink. In the [place] one drink equals one 12 oz bottle of beer (355 mL), one 5 oz glass of wine (148 mL), or one 1 oz glass of hard liquor (44 mL). Lifestyle Brush your teeth every morning and night with fluoride toothpaste. Floss one time each day. Exercise for at least 30 minutes 5 or more days each week. Do not use any products that contain nicotine or tobacco. These products include cigarettes, chewing tobacco, and vaping devices, such as e-cigarettes. If you need help quitting, ask your health care provider. Do not use drugs. If you are sexually active, practice safe sex. Use a condom or other form of protection to prevent STIs. Take aspirin only as told by your health care provider. Make sure that you understand how much to take and what form to take. Work with your health care provider to find out whether it is safe   and beneficial for you to take aspirin daily. Ask your health care provider if you need to take a cholesterol-lowering medicine (statin). Find healthy ways to manage stress, such  as: Meditation, yoga, or listening to music. Journaling. Talking to a trusted person. Spending time with friends and family. Safety Always wear your seat belt while driving or riding in a vehicle. Do not drive: If you have been drinking alcohol. Do not ride with someone who has been drinking. When you are tired or distracted. While texting. If you have been using any mind-altering substances or drugs. Wear a helmet and other protective equipment during sports activities. If you have firearms in your house, make sure you follow all gun safety procedures. Minimize exposure to UV radiation to reduce your risk of skin cancer. What's next? Visit your health care provider once a year for an annual wellness visit. Ask your health care provider how often you should have your eyes and teeth checked. Stay up to date on all vaccines. This information is not intended to replace advice given to you by your health care provider. Make sure you discuss any questions you have with your health care provider. Document Revised: 03/16/2021 Document Reviewed: 03/16/2021 Elsevier Patient Education  2024 Elsevier Inc.  

## 2023-08-12 ENCOUNTER — Other Ambulatory Visit: Payer: Self-pay | Admitting: Family Medicine

## 2023-08-12 DIAGNOSIS — I10 Essential (primary) hypertension: Secondary | ICD-10-CM

## 2023-08-12 DIAGNOSIS — E785 Hyperlipidemia, unspecified: Secondary | ICD-10-CM

## 2023-08-12 DIAGNOSIS — E119 Type 2 diabetes mellitus without complications: Secondary | ICD-10-CM

## 2023-09-16 ENCOUNTER — Other Ambulatory Visit: Payer: Self-pay | Admitting: Family Medicine

## 2023-09-27 ENCOUNTER — Telehealth: Payer: Self-pay

## 2023-09-27 NOTE — Telephone Encounter (Signed)
Copied from CRM [id number]. Topic: Clinical - Medication Refill >> Sep 25, 2023  8:15 AM Geroge Baseman wrote: Most Recent Primary Care Visit:  Provider: Everrett Coombe  Department: [facility]  Visit Type: OFFICE VISIT  Date: 08/09/2023  Medication: OZEMPIC  Has the patient contacted their pharmacy? No No, patient wants this refilled early if possible because he states his insurance will charge him three times the price for this in 2025.   Is this the correct pharmacy for this prescription? Yes If no, delete pharmacy and type the correct one.  This is the patient's preferred pharmacy:    [phone] [place], [place] - [address] [place] [address] Phone: [phone] Fax: [phone]   Has the prescription been filled recently? Yes  Is the patient out of the medication? No  Has the patient been seen for an appointment in the last year OR does the patient have an upcoming appointment? Yes  Can we respond through MyChart? Phone preferred  Agent: Please be advised that Rx refills may take up to 3 business days. We ask that you follow-up with your pharmacy.

## 2023-09-27 NOTE — Telephone Encounter (Signed)
Is there anything we can do to help with this ?  " patient wants this refilled early if possible because he states his insurance will charge him three times the price for this in 2025. "

## 2023-09-28 MED ORDER — OZEMPIC (0.25 OR 0.5 MG/DOSE) 2 MG/3ML ~~LOC~~ SOPN: PEN_INJECTOR | SUBCUTANEOUS | 0 refills | Status: DC

## 2023-09-28 NOTE — Telephone Encounter (Signed)
Patient informed and will contact [facility] to see if can process early.

## 2023-10-02 ENCOUNTER — Other Ambulatory Visit: Payer: Self-pay | Admitting: Family Medicine

## 2023-10-02 DIAGNOSIS — I10 Essential (primary) hypertension: Secondary | ICD-10-CM

## 2024-01-28 ENCOUNTER — Telehealth: Payer: Self-pay

## 2024-01-28 DIAGNOSIS — N183 Chronic kidney disease, stage 3 unspecified: Secondary | ICD-10-CM

## 2024-01-28 DIAGNOSIS — E785 Hyperlipidemia, unspecified: Secondary | ICD-10-CM

## 2024-01-28 DIAGNOSIS — I10 Essential (primary) hypertension: Secondary | ICD-10-CM

## 2024-01-28 DIAGNOSIS — E113293 Type 2 diabetes mellitus with mild nonproliferative diabetic retinopathy without macular edema, bilateral: Secondary | ICD-10-CM

## 2024-01-28 NOTE — Telephone Encounter (Signed)
 Copied from CRM [id number]. Topic: General - Other >> Jan 28, 2024  9:30 AM Jayson Michael wrote: Reason for CRM: patient called to state that he wants the lab notified he will be there 02/01/24 morning to give 3 vials of blood prior to his appointment with Adela Holter 02/04/2024

## 2024-01-28 NOTE — Telephone Encounter (Signed)
Did you want him to have labs?

## 2024-01-30 NOTE — Telephone Encounter (Signed)
 Left message advising patient

## 2024-01-30 NOTE — Addendum Note (Signed)
 Addended by: Emunah Texidor E on: 01/30/2024 04:34 PM   Modules accepted: Orders

## 2024-01-30 NOTE — Telephone Encounter (Signed)
 Copied from CRM [id number]. Topic: General - Other >> Jan 28, 2024  9:30 AM Jayson Michael wrote: Reason for CRM: patient called to state that he wants the lab notified he will be there 02/01/24 morning to give 3 vials of blood prior to his appointment with Adela Holter 02/04/2024 >> Jan 30, 2024  4:26 PM Tiffany H wrote: Patient would like to continue to come for labs. Patient would like a full panel ordered so that he can track his progress while on Ozempic . Patient would like to do every six months to continue to track progress.   Please order and follow up with patient. He'd like to come in for labs on Friday.

## 2024-02-01 DIAGNOSIS — E113293 Type 2 diabetes mellitus with mild nonproliferative diabetic retinopathy without macular edema, bilateral: Secondary | ICD-10-CM | POA: Diagnosis not present

## 2024-02-01 DIAGNOSIS — N183 Chronic kidney disease, stage 3 unspecified: Secondary | ICD-10-CM | POA: Diagnosis not present

## 2024-02-01 DIAGNOSIS — E785 Hyperlipidemia, unspecified: Secondary | ICD-10-CM | POA: Diagnosis not present

## 2024-02-01 DIAGNOSIS — I10 Essential (primary) hypertension: Secondary | ICD-10-CM | POA: Diagnosis not present

## 2024-02-02 LAB — CMP14+EGFR
ALT: 20 IU/L (ref 0–44)
AST: 18 IU/L (ref 0–40)
Albumin: 4 g/dL (ref 3.8–4.8)
Alkaline Phosphatase: 88 IU/L (ref 44–121)
BUN/Creatinine Ratio: 18 (ref 10–24)
BUN: 29 mg/dL — ABNORMAL HIGH (ref 8–27)
Bilirubin Total: 0.6 mg/dL (ref 0.0–1.2)
CO2: 24 mmol/L (ref 20–29)
Calcium: 9.7 mg/dL (ref 8.6–10.2)
Chloride: 100 mmol/L (ref 96–106)
Creatinine, Ser: 1.59 mg/dL — ABNORMAL HIGH (ref 0.76–1.27)
Globulin, Total: 2.6 g/dL (ref 1.5–4.5)
Glucose: 132 mg/dL — ABNORMAL HIGH (ref 70–99)
Potassium: 4.7 mmol/L (ref 3.5–5.2)
Sodium: 140 mmol/L (ref 134–144)
Total Protein: 6.6 g/dL (ref 6.0–8.5)
eGFR: 46 mL/min/{1.73_m2} — ABNORMAL LOW (ref >59)

## 2024-02-02 LAB — CBC WITH DIFFERENTIAL/PLATELET
Basophils Absolute: 0.1 10*3/uL (ref 0.0–0.2)
Basos: 1 %
EOS (ABSOLUTE): 0.3 10*3/uL (ref 0.0–0.4)
Eos: 5 %
Hematocrit: 41.9 % (ref 37.5–51.0)
Hemoglobin: 14.1 g/dL (ref 13.0–17.7)
Immature Grans (Abs): 0 10*3/uL (ref 0.0–0.1)
Immature Granulocytes: 0 %
Lymphocytes Absolute: 1.2 10*3/uL (ref 0.7–3.1)
Lymphs: 18 %
MCH: 30.6 pg (ref 26.6–33.0)
MCHC: 33.7 g/dL (ref 31.5–35.7)
MCV: 91 fL (ref 79–97)
Monocytes Absolute: 0.6 10*3/uL (ref 0.1–0.9)
Monocytes: 9 %
Neutrophils Absolute: 4.3 10*3/uL (ref 1.4–7.0)
Neutrophils: 67 %
Platelets: 207 10*3/uL (ref 150–450)
RBC: 4.61 x10E6/uL (ref 4.14–5.80)
RDW: 13.4 % (ref 11.6–15.4)
WBC: 6.5 10*3/uL (ref 3.4–10.8)

## 2024-02-02 LAB — LIPID PANEL WITH LDL/HDL RATIO
Cholesterol, Total: 81 mg/dL — ABNORMAL LOW (ref 100–199)
HDL: 28 mg/dL — ABNORMAL LOW (ref >39)
LDL Chol Calc (NIH): 33 mg/dL (ref 0–99)
LDL/HDL Ratio: 1.2 ratio (ref 0.0–3.6)
Triglycerides: 104 mg/dL (ref 0–149)
VLDL Cholesterol Cal: 20 mg/dL (ref 5–40)

## 2024-02-02 LAB — HEMOGLOBIN A1C
Est. average glucose Bld gHb Est-mCnc: 131 mg/dL
Hgb A1c MFr Bld: 6.2 % — ABNORMAL HIGH (ref 4.8–5.6)

## 2024-02-04 ENCOUNTER — Encounter: Payer: Self-pay | Admitting: Family Medicine

## 2024-02-04 ENCOUNTER — Ambulatory Visit (INDEPENDENT_AMBULATORY_CARE_PROVIDER_SITE_OTHER): Payer: Medicare HMO | Admitting: Family Medicine

## 2024-02-04 VITALS — BP 128/75 | HR 67 | Ht 72.0 in | Wt 251.0 lb

## 2024-02-04 DIAGNOSIS — M24549 Contracture, unspecified hand: Secondary | ICD-10-CM

## 2024-02-04 DIAGNOSIS — N183 Chronic kidney disease, stage 3 unspecified: Secondary | ICD-10-CM | POA: Diagnosis not present

## 2024-02-04 DIAGNOSIS — E785 Hyperlipidemia, unspecified: Secondary | ICD-10-CM

## 2024-02-04 DIAGNOSIS — E113293 Type 2 diabetes mellitus with mild nonproliferative diabetic retinopathy without macular edema, bilateral: Secondary | ICD-10-CM | POA: Diagnosis not present

## 2024-02-04 DIAGNOSIS — I10 Essential (primary) hypertension: Secondary | ICD-10-CM | POA: Diagnosis not present

## 2024-02-04 MED ORDER — SEMAGLUTIDE (1 MG/DOSE) 4 MG/3ML ~~LOC~~ SOPN: 1.0000 mg | PEN_INJECTOR | SUBCUTANEOUS | 1 refills | Status: DC

## 2024-02-04 MED ORDER — OZEMPIC (0.25 OR 0.5 MG/DOSE) 2 MG/3ML ~~LOC~~ SOPN: PEN_INJECTOR | SUBCUTANEOUS | 1 refills | Status: DC

## 2024-02-04 NOTE — Assessment & Plan Note (Addendum)
 BP is fairly well controlled, has had some episodic lightheadedness.  D/c hydrochlorothiazide .  Continue irbesartan .

## 2024-02-04 NOTE — Progress Notes (Signed)
 Pt has scheduled Eye exam. Has received Cologuard but didn't realize Dr. Augustus Ledger had ordered. Agrees to completing Cologuard.

## 2024-02-04 NOTE — Assessment & Plan Note (Signed)
Continues to tolerate atorvastatin well.  Continue at current strength. ?

## 2024-02-04 NOTE — Progress Notes (Signed)
 George Spence - 73 y.o. male MRN [id number]  Date of birth: 27-Oct-1950  Subjective Chief Complaint  Patient presents with   Diabetes    HPI George Spence is a 73 y.o. male here today for follow up visit.   Reports that the is doing well   His diabetes is treated with Ozempic  0.5mg  weekly.   He is tolerating this well at current strength.  His A1c today is 6.2%  His weight is down about 25lbs since November.  He remains on atorvastatin  for HLD.   He also is on irbesartan  and hydrochlorothiazide  for management of HTN.  BP is is well controlled with current medications. He is getting lightheaded at times since he has had weight loss.  He has not had chest pain, shortness of breath, palpitations, headache or vision changes.   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    ROS:  A comprehensive ROS was completed and negative except as noted per HPI  No Known Allergies  Past Medical History:  Diagnosis Date   Diabetes (HCC)    Hyperlipidemia    Hypertension    Type 2 diabetes mellitus without complication (HCC) 08/12/2014    History reviewed. No pertinent surgical history.  Social History   Socioeconomic History   Marital status: Single    Spouse name: Not on file   Number of children: Not on file   Years of education: Not on file   Highest education level: Not on file  Occupational History   Not on file  Tobacco Use   Smoking status: Former    Current packs/day: 0.00    Types: Cigarettes    Quit date: 08/13/1979    Years since quitting: 44.5   Smokeless tobacco: Never  Substance and Sexual Activity   Alcohol use: Yes    Alcohol/week: 2.0 standard drinks of alcohol    Types: 2 Standard drinks or equivalent per week   Drug use: No   Sexual activity: Yes    Partners: Female  Other Topics Concern   Not on file  Social History Narrative   Not on file   Social Drivers of Health   Financial Resource Strain: Not on file  Food Insecurity:  Not on file  Transportation Needs: Not on file  Physical Activity: Not on file  Stress: Not on file  Social Connections: Not on file    History reviewed. No pertinent family history.  Health Maintenance  Topic Date Due   Medicare Annual Wellness (AWV)  Never done   Fecal DNA (Cologuard)  07/21/2022   OPHTHALMOLOGY EXAM  08/04/2023   COVID-19 Vaccine (3 - 2024-25 season) 07/22/2024 (Originally 06/03/2023)   Hepatitis C Screening  02/03/2025 (Originally 07/28/1969)   INFLUENZA VACCINE  05/02/2024   HEMOGLOBIN A1C  08/03/2024   Diabetic kidney evaluation - Urine ACR  08/06/2024   Diabetic kidney evaluation - eGFR measurement  01/31/2025   FOOT EXAM  02/03/2025   DTaP/Tdap/Td (2 - Td or Tdap) 04/19/2027   Pneumonia Vaccine 33+ Years old  Completed   Zoster Vaccines- Shingrix  Completed   HPV VACCINES  Aged Out   Meningococcal B Vaccine  Aged Out   Colonoscopy  Discontinued     ----------------------------------------------------------------------------------------------------------------------------------------------------------------------------------------------------------------- Physical Exam BP 128/75 (BP Location: Left Arm, Patient Position: Sitting, Cuff Size: Normal)   Pulse 67   Ht 6' (1.829 m)   Wt 251 lb (113.9 kg)   SpO2 97%   BMI 34.04 kg/m  Physical Exam Constitutional:      Appearance: Normal appearance.  Eyes:     General: No scleral icterus. Cardiovascular:     Rate and Rhythm: Normal rate and regular rhythm.  Pulmonary:     Effort: Pulmonary effort is normal.     Breath sounds: Normal breath sounds.  Neurological:     Mental Status: He is alert.  Psychiatric:        Mood and Affect: Mood normal.        Behavior: Behavior normal.      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  Titrating to 1mg  as he is tolerating well.  Return in 6 months.   CKD (chronic kidney disease) stage 3, GFR 30-59 ml/min (HCC) Renal function stable based on recent lab work.    Essential hypertension BP is fairly well controlled, has had some episodic lightheadedness.  D/c hydrochlorothiazide .  Continue irbesartan .   Hyperlipidemia Continues to tolerate atorvastatin  well.  Continue at current strength.   Meds ordered this encounter  Medications   DISCONTD: Semaglutide ,0.25 or 0.5MG /DOS, (OZEMPIC , 0.25 OR 0.5 MG/DOSE,) 2 MG/3ML SOPN    Sig: INJECT 0.5MG  SUBCUTANEOUSLYWEEKLY (EVERY 7 DAYS)    Dispense:  9 mL    Refill:  1   Semaglutide , 1 MG/DOSE, 4 MG/3ML SOPN    Sig: Inject 1 mg into the skin once a week.    Dispense:  9 mL    Refill:  1    Return in about 6 months (around 08/06/2024) for Hypertension, Type 2 Diabetes.

## 2024-02-04 NOTE — Assessment & Plan Note (Signed)
 Overall he is doing well with Ozempic .  Titrating to 1mg  as he is tolerating well.  Return in 6 months.

## 2024-02-04 NOTE — Assessment & Plan Note (Signed)
 Renal function stable based on recent lab work.

## 2024-02-08 ENCOUNTER — Other Ambulatory Visit: Payer: Self-pay | Admitting: Family Medicine

## 2024-02-08 DIAGNOSIS — I10 Essential (primary) hypertension: Secondary | ICD-10-CM

## 2024-03-07 DIAGNOSIS — H524 Presbyopia: Secondary | ICD-10-CM | POA: Diagnosis not present

## 2024-03-07 DIAGNOSIS — E139 Other specified diabetes mellitus without complications: Secondary | ICD-10-CM | POA: Diagnosis not present

## 2024-03-07 LAB — HM DIABETES EYE EXAM

## 2024-03-16 ENCOUNTER — Other Ambulatory Visit: Payer: Self-pay | Admitting: Family Medicine

## 2024-03-16 DIAGNOSIS — I10 Essential (primary) hypertension: Secondary | ICD-10-CM

## 2024-03-18 NOTE — Progress Notes (Signed)
 This patient is appearing on a report for being at risk of failing the adherence measure for diabetes medications this calendar year.   Medication: Ozempic  1mg  Last fill date: 02/01/2024 for 84 day supply  Insurance report was not up to date. No action needed at this time.   Linn Rich, PharmD, DPLA

## 2024-06-03 ENCOUNTER — Encounter: Payer: Self-pay | Admitting: Sports Medicine

## 2024-06-12 ENCOUNTER — Telehealth: Payer: Self-pay

## 2024-06-12 NOTE — Telephone Encounter (Signed)
 I called patient for Colon Cancer Screening. No answer and no voicemail. He is due for another Cologuard.

## 2024-07-17 NOTE — Progress Notes (Signed)
 George Spence                                          MRN: [id number]   07/17/2024   The [facility] Quality Team Specialist reviewed this patient medical record for the purposes of chart review for care gap closure. The following were reviewed: chart review for care gap closure-kidney health evaluation for diabetes:eGFR  and uACR.    [facility] Quality Team

## 2024-07-20 ENCOUNTER — Other Ambulatory Visit: Payer: Self-pay | Admitting: Family Medicine

## 2024-07-30 ENCOUNTER — Other Ambulatory Visit: Payer: Self-pay | Admitting: Family Medicine

## 2024-07-30 DIAGNOSIS — E785 Hyperlipidemia, unspecified: Secondary | ICD-10-CM

## 2024-07-30 DIAGNOSIS — E119 Type 2 diabetes mellitus without complications: Secondary | ICD-10-CM

## 2024-08-04 ENCOUNTER — Telehealth: Payer: Self-pay

## 2024-08-04 NOTE — Telephone Encounter (Signed)
 Copied from CRM [id number]. Topic: Clinical - Request for Lab/Test Order >> Aug 04, 2024  7:50 AM Darshell M wrote: Reason for CRM: Patient requesting lab orders 08/11/24 office visit.

## 2024-08-08 ENCOUNTER — Other Ambulatory Visit: Payer: Self-pay | Admitting: Family Medicine

## 2024-08-08 ENCOUNTER — Ambulatory Visit: Admitting: Family Medicine

## 2024-08-08 DIAGNOSIS — Z125 Encounter for screening for malignant neoplasm of prostate: Secondary | ICD-10-CM | POA: Diagnosis not present

## 2024-08-08 DIAGNOSIS — I10 Essential (primary) hypertension: Secondary | ICD-10-CM | POA: Diagnosis not present

## 2024-08-08 DIAGNOSIS — E11319 Type 2 diabetes mellitus with unspecified diabetic retinopathy without macular edema: Secondary | ICD-10-CM

## 2024-08-08 DIAGNOSIS — N183 Chronic kidney disease, stage 3 unspecified: Secondary | ICD-10-CM

## 2024-08-08 DIAGNOSIS — E785 Hyperlipidemia, unspecified: Secondary | ICD-10-CM

## 2024-08-09 LAB — CBC WITH DIFFERENTIAL/PLATELET
Basophils Absolute: 0.1 x10E3/uL (ref 0.0–0.2)
Basos: 1 %
EOS (ABSOLUTE): 0.4 x10E3/uL (ref 0.0–0.4)
Eos: 6 %
Hematocrit: 46.6 % (ref 37.5–51.0)
Hemoglobin: 15.5 g/dL (ref 13.0–17.7)
Immature Grans (Abs): 0 x10E3/uL (ref 0.0–0.1)
Immature Granulocytes: 0 %
Lymphocytes Absolute: 1.5 x10E3/uL (ref 0.7–3.1)
Lymphs: 26 %
MCH: 30.6 pg (ref 26.6–33.0)
MCHC: 33.3 g/dL (ref 31.5–35.7)
MCV: 92 fL (ref 79–97)
Monocytes Absolute: 0.7 x10E3/uL (ref 0.1–0.9)
Monocytes: 12 %
Neutrophils Absolute: 3.2 x10E3/uL (ref 1.4–7.0)
Neutrophils: 55 %
Platelets: 198 x10E3/uL (ref 150–450)
RBC: 5.07 x10E6/uL (ref 4.14–5.80)
RDW: 12.8 % (ref 11.6–15.4)
WBC: 5.9 x10E3/uL (ref 3.4–10.8)

## 2024-08-09 LAB — CMP14+EGFR
ALT: 23 IU/L (ref 0–44)
AST: 25 IU/L (ref 0–40)
Albumin: 4.2 g/dL (ref 3.8–4.8)
Alkaline Phosphatase: 85 IU/L (ref 47–123)
BUN/Creatinine Ratio: 19 (ref 10–24)
BUN: 25 mg/dL (ref 8–27)
Bilirubin Total: 0.8 mg/dL (ref 0.0–1.2)
CO2: 26 mmol/L (ref 20–29)
Calcium: 9.5 mg/dL (ref 8.6–10.2)
Chloride: 101 mmol/L (ref 96–106)
Creatinine, Ser: 1.3 mg/dL — ABNORMAL HIGH (ref 0.76–1.27)
Globulin, Total: 2.7 g/dL (ref 1.5–4.5)
Glucose: 111 mg/dL — ABNORMAL HIGH (ref 70–99)
Potassium: 5 mmol/L (ref 3.5–5.2)
Sodium: 141 mmol/L (ref 134–144)
Total Protein: 6.9 g/dL (ref 6.0–8.5)
eGFR: 58 mL/min/1.73 — ABNORMAL LOW (ref >59)

## 2024-08-09 LAB — HEMOGLOBIN A1C
Est. average glucose Bld gHb Est-mCnc: 120 mg/dL
Hgb A1c MFr Bld: 5.8 % — ABNORMAL HIGH (ref 4.8–5.6)

## 2024-08-09 LAB — LIPID PANEL WITH LDL/HDL RATIO
Cholesterol, Total: 100 mg/dL (ref 100–199)
HDL: 36 mg/dL — ABNORMAL LOW (ref >39)
LDL Chol Calc (NIH): 48 mg/dL (ref 0–99)
LDL/HDL Ratio: 1.3 ratio (ref 0.0–3.6)
Triglycerides: 80 mg/dL (ref 0–149)
VLDL Cholesterol Cal: 16 mg/dL (ref 5–40)

## 2024-08-09 LAB — MICROALBUMIN / CREATININE URINE RATIO
Creatinine, Urine: 131.2 mg/dL
Microalb/Creat Ratio: 35 mg/g{creat} — ABNORMAL HIGH (ref 0–29)
Microalbumin, Urine: 45.4 ug/mL

## 2024-08-09 LAB — PSA: Prostate Specific Ag, Serum: 1.9 ng/mL (ref 0.0–4.0)

## 2024-08-11 ENCOUNTER — Ambulatory Visit: Admitting: Family Medicine

## 2024-08-11 ENCOUNTER — Encounter: Payer: Self-pay | Admitting: Family Medicine

## 2024-08-11 VITALS — BP 174/76 | HR 68 | Ht 72.0 in | Wt 248.0 lb

## 2024-08-11 DIAGNOSIS — N1832 Chronic kidney disease, stage 3b: Secondary | ICD-10-CM | POA: Diagnosis not present

## 2024-08-11 DIAGNOSIS — E785 Hyperlipidemia, unspecified: Secondary | ICD-10-CM

## 2024-08-11 DIAGNOSIS — I10 Essential (primary) hypertension: Secondary | ICD-10-CM | POA: Diagnosis not present

## 2024-08-11 DIAGNOSIS — Z7985 Long-term (current) use of injectable non-insulin antidiabetic drugs: Secondary | ICD-10-CM | POA: Diagnosis not present

## 2024-08-11 DIAGNOSIS — E1122 Type 2 diabetes mellitus with diabetic chronic kidney disease: Secondary | ICD-10-CM | POA: Diagnosis not present

## 2024-08-11 DIAGNOSIS — E119 Type 2 diabetes mellitus without complications: Secondary | ICD-10-CM | POA: Insufficient documentation

## 2024-08-11 MED ORDER — OZEMPIC (1 MG/DOSE) 4 MG/3ML ~~LOC~~ SOPN: 1.0000 mg | PEN_INJECTOR | SUBCUTANEOUS | 1 refills | Status: AC

## 2024-08-11 MED ORDER — IRBESARTAN 150 MG PO TABS: 150.0000 mg | ORAL_TABLET | Freq: Every day | ORAL | 1 refills | Status: AC

## 2024-08-11 NOTE — Assessment & Plan Note (Signed)
Continues to tolerate atorvastatin well.  Continue at current strength. ?

## 2024-08-11 NOTE — Progress Notes (Signed)
 George Spence - 73 y.o. male MRN [id number]  Date of birth: [birth year]  Subjective Chief Complaint  Patient presents with   Hypertension   Diabetes    HPI George Spence is a 73 y.o. male here today for follow up visit.   He reports that he is doing pretty well.  He had labs completed prior to visit today which we reviewed in detail.SABRA   He continues on Ozempic  1mg  daily.  His most recent A1c is down to 5.8%.  Weight is down to 251lbs. he is tolerating Ozempic  well at current strength.  No significant side effects at this time.  Tolerating atorvastatin  at current strength for coexisting hyperlipidemia.  Recent LDL levels are well-controlled.  BP elevated today.  He is taking irbesartan  as directed.  Denies side effects at current strength.  He has not had chest pain, shortness of breath, palpitations, headaches or vision changes.  ROS:  A comprehensive ROS was completed and negative except as noted per HPI   No Known Allergies  Past Medical History:  Diagnosis Date   Diabetes (HCC)    Hyperlipidemia    Hypertension    Type 2 diabetes mellitus without complication 08/12/2014    History reviewed. No pertinent surgical history.  Social History   Socioeconomic History   Marital status: Single    Spouse name: Not on file   Number of children: Not on file   Years of education: Not on file   Highest education level: Not on file  Occupational History   Not on file  Tobacco Use   Smoking status: Former    Current packs/day: 0.00    Types: Cigarettes    Quit date: 08/13/1979    Years since quitting: 45.0   Smokeless tobacco: Never  Substance and Sexual Activity   Alcohol use: Yes    Alcohol/week: 2.0 standard drinks of alcohol    Types: 2 Standard drinks or equivalent per week   Drug use: No   Sexual activity: Yes    Partners: Female  Other Topics Concern   Not on file  Social History Narrative   Not on file   Social Drivers of Health   Financial Resource Strain:  Not on file  Food Insecurity: Not on file  Transportation Needs: Not on file  Physical Activity: Not on file  Stress: Not on file  Social Connections: Not on file    History reviewed. No pertinent family history.  Health Maintenance  Topic Date Due   Medicare Annual Wellness (AWV)  Never done   Influenza Vaccine  12/30/2024 (Originally 05/02/2024)   Hepatitis C Screening  02/03/2025 (Originally 07/28/1969)   Fecal DNA (Cologuard)  08/11/2025 (Originally 07/21/2022)   COVID-19 Vaccine (3 - 2025-26 season) 08/27/2025 (Originally 06/02/2024)   FOOT EXAM  02/03/2025   HEMOGLOBIN A1C  02/05/2025   OPHTHALMOLOGY EXAM  03/07/2025   Diabetic kidney evaluation - eGFR measurement  08/08/2025   Diabetic kidney evaluation - Urine ACR  08/08/2025   DTaP/Tdap/Td (2 - Td or Tdap) 04/19/2027   Pneumococcal Vaccine: 50+ Years  Completed   Zoster Vaccines- Shingrix  Completed   Meningococcal B Vaccine  Aged Out   Colonoscopy  Discontinued     ----------------------------------------------------------------------------------------------------------------------------------------------------------------------------------------------------------------- Physical Exam BP (!) 174/76   Pulse 68   Ht 6' (1.829 m)   Wt 248 lb (112.5 kg)   SpO2 96%   BMI 33.63 kg/m   Physical Exam Constitutional:      Appearance: Normal appearance.  HENT:  Head: Normocephalic and atraumatic.  Eyes:     General: No scleral icterus. Cardiovascular:     Rate and Rhythm: Normal rate and regular rhythm.  Pulmonary:     Effort: Pulmonary effort is normal.     Breath sounds: Normal breath sounds.  Musculoskeletal:     Cervical back: Neck supple.  Neurological:     Mental Status: He is alert.  Psychiatric:        Mood and Affect: Mood normal.        Behavior: Behavior normal.      ------------------------------------------------------------------------------------------------------------------------------------------------------------------------------------------------------------------- Assessment and Plan  Essential hypertension Blood pressure is elevated today.  Return in 2 weeks for blood pressure recheck.  Continue irbesartan  at current strength for now..   Hyperlipidemia Continues to tolerate atorvastatin  well.  Continue at current strength.  Type 2 diabetes mellitus (HCC) Blood sugars are better controlled.  Continue GLP-1 at current strength.  Continue irbesartan  at current strength for CKD.  Can consider addition of SGLT2 inhibitor.   Meds ordered this encounter  Medications   irbesartan  (AVAPRO ) 150 MG tablet    Sig: Take 1 tablet (150 mg total) by mouth daily.    Dispense:  90 tablet    Refill:  1   Semaglutide , 1 MG/DOSE, (OZEMPIC , 1 MG/DOSE,) 4 MG/3ML SOPN    Sig: Inject 1 mg into the skin once a week.    Dispense:  9 mL    Refill:  1    Return in about 6 months (around 02/08/2025) for Hypertension, Type 2 Diabetes.

## 2024-08-11 NOTE — Assessment & Plan Note (Signed)
 Blood pressure is elevated today.  Return in 2 weeks for blood pressure recheck.  Continue irbesartan  at current strength for now.SABRA

## 2024-08-11 NOTE — Assessment & Plan Note (Signed)
 Blood sugars are better controlled.  Continue GLP-1 at current strength.  Continue irbesartan  at current strength for CKD.  Can consider addition of SGLT2 inhibitor.

## 2024-09-04 ENCOUNTER — Ambulatory Visit

## 2024-09-12 NOTE — Progress Notes (Signed)
 George Spence                                          MRN: [id number]   09/12/2024   The [facility] Quality Team Specialist reviewed this patient medical record for the purposes of chart review for care gap closure. The following were reviewed: abstraction for care gap closure-kidney health evaluation for diabetes:eGFR  and uACR.    [facility]BCI Quality Team

## 2025-02-03 ENCOUNTER — Ambulatory Visit: Admitting: Family Medicine
